# Patient Record
Sex: Female | Born: 1965
Health system: Southern US, Community
[De-identification: ages and names within clinical notes are randomized; demographics above are authoritative.]

## PROBLEM LIST (undated history)

## (undated) DIAGNOSIS — T7840XA Allergy, unspecified, initial encounter: Secondary | ICD-10-CM

## (undated) DIAGNOSIS — M47812 Spondylosis without myelopathy or radiculopathy, cervical region: Secondary | ICD-10-CM

## (undated) DIAGNOSIS — M419 Scoliosis, unspecified: Secondary | ICD-10-CM

## (undated) DIAGNOSIS — Z79890 Hormone replacement therapy (postmenopausal): Secondary | ICD-10-CM

## (undated) DIAGNOSIS — I10 Essential (primary) hypertension: Secondary | ICD-10-CM

## (undated) DIAGNOSIS — F419 Anxiety disorder, unspecified: Secondary | ICD-10-CM

## (undated) DIAGNOSIS — G43909 Migraine, unspecified, not intractable, without status migrainosus: Secondary | ICD-10-CM

## (undated) HISTORY — DX: Hormone replacement therapy: Z79.890

## (undated) HISTORY — DX: Scoliosis, unspecified: M41.9

## (undated) HISTORY — DX: Spondylosis without myelopathy or radiculopathy, cervical region: M47.812

## (undated) HISTORY — DX: Migraine, unspecified, not intractable, without status migrainosus: G43.909

## (undated) HISTORY — DX: Anxiety disorder, unspecified: F41.9

## (undated) HISTORY — DX: Essential (primary) hypertension: I10

## (undated) HISTORY — PX: OTHER SURGICAL HISTORY: SHX169

## (undated) HISTORY — DX: Allergy, unspecified, initial encounter: T78.40XA

---

## 1997-07-16 HISTORY — PX: LIPOMA EXCISION: SHX5283

## 1997-08-26 ENCOUNTER — Inpatient Hospital Stay (HOSPITAL_COMMUNITY): Admission: AD | Admit: 1997-08-26 | Discharge: 1997-08-28 | Payer: Self-pay | Admitting: Obstetrics & Gynecology

## 1997-08-31 ENCOUNTER — Encounter: Admission: RE | Admit: 1997-08-31 | Discharge: 1997-11-29 | Payer: Self-pay | Admitting: Obstetrics & Gynecology

## 1997-10-12 ENCOUNTER — Other Ambulatory Visit: Admission: RE | Admit: 1997-10-12 | Discharge: 1997-10-12 | Payer: Self-pay | Admitting: Obstetrics and Gynecology

## 1998-10-26 ENCOUNTER — Other Ambulatory Visit: Admission: RE | Admit: 1998-10-26 | Discharge: 1998-10-26 | Payer: Self-pay | Admitting: Obstetrics and Gynecology

## 1999-06-20 ENCOUNTER — Encounter (INDEPENDENT_AMBULATORY_CARE_PROVIDER_SITE_OTHER): Payer: Self-pay

## 1999-06-20 ENCOUNTER — Other Ambulatory Visit: Admission: RE | Admit: 1999-06-20 | Discharge: 1999-06-20 | Payer: Self-pay | Admitting: General Surgery

## 2000-01-05 ENCOUNTER — Other Ambulatory Visit: Admission: RE | Admit: 2000-01-05 | Discharge: 2000-01-05 | Payer: Self-pay | Admitting: Obstetrics and Gynecology

## 2000-11-11 ENCOUNTER — Other Ambulatory Visit: Admission: RE | Admit: 2000-11-11 | Discharge: 2000-11-11 | Payer: Self-pay | Admitting: Obstetrics and Gynecology

## 2002-07-03 ENCOUNTER — Other Ambulatory Visit: Admission: RE | Admit: 2002-07-03 | Discharge: 2002-07-03 | Payer: Self-pay | Admitting: Obstetrics and Gynecology

## 2003-08-03 ENCOUNTER — Other Ambulatory Visit: Admission: RE | Admit: 2003-08-03 | Discharge: 2003-08-03 | Payer: Self-pay | Admitting: Obstetrics and Gynecology

## 2004-08-16 ENCOUNTER — Other Ambulatory Visit: Admission: RE | Admit: 2004-08-16 | Discharge: 2004-08-16 | Payer: Self-pay | Admitting: Obstetrics and Gynecology

## 2005-09-06 ENCOUNTER — Other Ambulatory Visit: Admission: RE | Admit: 2005-09-06 | Discharge: 2005-09-06 | Payer: Self-pay | Admitting: Obstetrics and Gynecology

## 2007-05-18 ENCOUNTER — Emergency Department (HOSPITAL_COMMUNITY): Admission: EM | Admit: 2007-05-18 | Discharge: 2007-05-18 | Payer: Self-pay | Admitting: Emergency Medicine

## 2012-01-28 ENCOUNTER — Encounter: Payer: Self-pay | Admitting: Family Medicine

## 2012-01-28 ENCOUNTER — Ambulatory Visit (INDEPENDENT_AMBULATORY_CARE_PROVIDER_SITE_OTHER): Payer: 59 | Admitting: Family Medicine

## 2012-01-28 VITALS — BP 112/74 | HR 68 | Temp 98.6°F | Ht 65.0 in | Wt 142.0 lb

## 2012-01-28 DIAGNOSIS — Z0000 Encounter for general adult medical examination without abnormal findings: Secondary | ICD-10-CM

## 2012-01-28 DIAGNOSIS — I10 Essential (primary) hypertension: Secondary | ICD-10-CM | POA: Insufficient documentation

## 2012-01-28 NOTE — Assessment & Plan Note (Signed)
bp has been great lately--- will stop norvasc since she was only taking it periodically and recheck in 3 months or sooner prn Pt will check bp as well

## 2012-01-28 NOTE — Patient Instructions (Addendum)
Preventive Care for Adults, Female A healthy lifestyle and preventive care can promote health and wellness. Preventive health guidelines for women include the following key practices.  A routine yearly physical is a good way to check with your caregiver about your health and preventive screening. It is a chance to share any concerns and updates on your health, and to receive a thorough exam.   Visit your dentist for a routine exam and preventive care every 6 months. Brush your teeth twice a day and floss once a day. Good oral hygiene prevents tooth decay and gum disease.   The frequency of eye exams is based on your age, health, family medical history, use of contact lenses, and other factors. Follow your caregiver's recommendations for frequency of eye exams.   Eat a healthy diet. Foods like vegetables, fruits, whole grains, low-fat dairy products, and lean protein foods contain the nutrients you need without too many calories. Decrease your intake of foods high in solid fats, added sugars, and salt. Eat the right amount of calories for you.Get information about a proper diet from your caregiver, if necessary.   Regular physical exercise is one of the most important things you can do for your health. Most adults should get at least 150 minutes of moderate-intensity exercise (any activity that increases your heart rate and causes you to sweat) each week. In addition, most adults need muscle-strengthening exercises on 2 or more days a week.   Maintain a healthy weight. The body mass index (BMI) is a screening tool to identify possible weight problems. It provides an estimate of body fat based on height and weight. Your caregiver can help determine your BMI, and can help you achieve or maintain a healthy weight.For adults 20 years and older:   A BMI below 18.5 is considered underweight.   A BMI of 18.5 to 24.9 is normal.   A BMI of 25 to 29.9 is considered overweight.   A BMI of 30 and above is  considered obese.   Maintain normal blood lipids and cholesterol levels by exercising and minimizing your intake of saturated fat. Eat a balanced diet with plenty of fruit and vegetables. Blood tests for lipids and cholesterol should begin at age 20 and be repeated every 5 years. If your lipid or cholesterol levels are high, you are over 50, or you are at high risk for heart disease, you may need your cholesterol levels checked more frequently.Ongoing high lipid and cholesterol levels should be treated with medicines if diet and exercise are not effective.   If you smoke, find out from your caregiver how to quit. If you do not use tobacco, do not start.   If you are pregnant, do not drink alcohol. If you are breastfeeding, be very cautious about drinking alcohol. If you are not pregnant and choose to drink alcohol, do not exceed 1 drink per day. One drink is considered to be 12 ounces (355 mL) of beer, 5 ounces (148 mL) of wine, or 1.5 ounces (44 mL) of liquor.   Avoid use of street drugs. Do not share needles with anyone. Ask for help if you need support or instructions about stopping the use of drugs.   High blood pressure causes heart disease and increases the risk of stroke. Your blood pressure should be checked at least every 1 to 2 years. Ongoing high blood pressure should be treated with medicines if weight loss and exercise are not effective.   If you are 55 to 47   years old, ask your caregiver if you should take aspirin to prevent strokes.   Diabetes screening involves taking a blood sample to check your fasting blood sugar level. This should be done once every 3 years, after age 45, if you are within normal weight and without risk factors for diabetes. Testing should be considered at a younger age or be carried out more frequently if you are overweight and have at least 1 risk factor for diabetes.   Breast cancer screening is essential preventive care for women. You should practice "breast  self-awareness." This means understanding the normal appearance and feel of your breasts and may include breast self-examination. Any changes detected, no matter how small, should be reported to a caregiver. Women in their 20s and 30s should have a clinical breast exam (CBE) by a caregiver as part of a regular health exam every 1 to 3 years. After age 40, women should have a CBE every year. Starting at age 40, women should consider having a mammography (breast X-ray test) every year. Women who have a family history of breast cancer should talk to their caregiver about genetic screening. Women at a high risk of breast cancer should talk to their caregivers about having magnetic resonance imaging (MRI) and a mammography every year.   The Pap test is a screening test for cervical cancer. A Pap test can show cell changes on the cervix that might become cervical cancer if left untreated. A Pap test is a procedure in which cells are obtained and examined from the lower end of the uterus (cervix).   Women should have a Pap test starting at age 21.   Between ages 21 and 29, Pap tests should be repeated every 2 years.   Beginning at age 30, you should have a Pap test every 3 years as long as the past 3 Pap tests have been normal.   Some women have medical problems that increase the chance of getting cervical cancer. Talk to your caregiver about these problems. It is especially important to talk to your caregiver if a new problem develops soon after your last Pap test. In these cases, your caregiver may recommend more frequent screening and Pap tests.   The above recommendations are the same for women who have or have not gotten the vaccine for human papillomavirus (HPV).   If you had a hysterectomy for a problem that was not cancer or a condition that could lead to cancer, then you no longer need Pap tests. Even if you no longer need a Pap test, a regular exam is a good idea to make sure no other problems are  starting.   If you are between ages 65 and 70, and you have had normal Pap tests going back 10 years, you no longer need Pap tests. Even if you no longer need a Pap test, a regular exam is a good idea to make sure no other problems are starting.   If you have had past treatment for cervical cancer or a condition that could lead to cancer, you need Pap tests and screening for cancer for at least 20 years after your treatment.   If Pap tests have been discontinued, risk factors (such as a new sexual partner) need to be reassessed to determine if screening should be resumed.   The HPV test is an additional test that may be used for cervical cancer screening. The HPV test looks for the virus that can cause the cell changes on the cervix.   The cells collected during the Pap test can be tested for HPV. The HPV test could be used to screen women aged 30 years and older, and should be used in women of any age who have unclear Pap test results. After the age of 30, women should have HPV testing at the same frequency as a Pap test.   Colorectal cancer can be detected and often prevented. Most routine colorectal cancer screening begins at the age of 50 and continues through age 75. However, your caregiver may recommend screening at an earlier age if you have risk factors for colon cancer. On a yearly basis, your caregiver may provide home test kits to check for hidden blood in the stool. Use of a small camera at the end of a tube, to directly examine the colon (sigmoidoscopy or colonoscopy), can detect the earliest forms of colorectal cancer. Talk to your caregiver about this at age 50, when routine screening begins. Direct examination of the colon should be repeated every 5 to 10 years through age 75, unless early forms of pre-cancerous polyps or small growths are found.   Hepatitis C blood testing is recommended for all people born from 1945 through 1965 and any individual with known risks for hepatitis C.    Practice safe sex. Use condoms and avoid high-risk sexual practices to reduce the spread of sexually transmitted infections (STIs). STIs include gonorrhea, chlamydia, syphilis, trichomonas, herpes, HPV, and human immunodeficiency virus (HIV). Herpes, HIV, and HPV are viral illnesses that have no cure. They can result in disability, cancer, and death. Sexually active women aged 25 and younger should be checked for chlamydia. Older women with new or multiple partners should also be tested for chlamydia. Testing for other STIs is recommended if you are sexually active and at increased risk.   Osteoporosis is a disease in which the bones lose minerals and strength with aging. This can result in serious bone fractures. The risk of osteoporosis can be identified using a bone density scan. Women ages 65 and over and women at risk for fractures or osteoporosis should discuss screening with their caregivers. Ask your caregiver whether you should take a calcium supplement or vitamin D to reduce the rate of osteoporosis.   Menopause can be associated with physical symptoms and risks. Hormone replacement therapy is available to decrease symptoms and risks. You should talk to your caregiver about whether hormone replacement therapy is right for you.   Use sunscreen with sun protection factor (SPF) of 30 or more. Apply sunscreen liberally and repeatedly throughout the day. You should seek shade when your shadow is shorter than you. Protect yourself by wearing long sleeves, pants, a wide-brimmed hat, and sunglasses year round, whenever you are outdoors.   Once a month, do a whole body skin exam, using a mirror to look at the skin on your back. Notify your caregiver of new moles, moles that have irregular borders, moles that are larger than a pencil eraser, or moles that have changed in shape or color.   Stay current with required immunizations.   Influenza. You need a dose every fall (or winter). The composition of  the flu vaccine changes each year, so being vaccinated once is not enough.   Pneumococcal polysaccharide. You need 1 to 2 doses if you smoke cigarettes or if you have certain chronic medical conditions. You need 1 dose at age 65 (or older) if you have never been vaccinated.   Tetanus, diphtheria, pertussis (Tdap, Td). Get 1 dose of   Tdap vaccine if you are younger than age 65, are over 65 and have contact with an infant, are a healthcare worker, are pregnant, or simply want to be protected from whooping cough. After that, you need a Td booster dose every 10 years. Consult your caregiver if you have not had at least 3 tetanus and diphtheria-containing shots sometime in your life or have a deep or dirty wound.   HPV. You need this vaccine if you are a woman age 26 or younger. The vaccine is given in 3 doses over 6 months.   Measles, mumps, rubella (MMR). You need at least 1 dose of MMR if you were born in 1957 or later. You may also need a second dose.   Meningococcal. If you are age 19 to 21 and a first-year college student living in a residence hall, or have one of several medical conditions, you need to get vaccinated against meningococcal disease. You may also need additional booster doses.   Zoster (shingles). If you are age 60 or older, you should get this vaccine.   Varicella (chickenpox). If you have never had chickenpox or you were vaccinated but received only 1 dose, talk to your caregiver to find out if you need this vaccine.   Hepatitis A. You need this vaccine if you have a specific risk factor for hepatitis A virus infection or you simply wish to be protected from this disease. The vaccine is usually given as 2 doses, 6 to 18 months apart.   Hepatitis B. You need this vaccine if you have a specific risk factor for hepatitis B virus infection or you simply wish to be protected from this disease. The vaccine is given in 3 doses, usually over 6 months.  Preventive Services /  Frequency Ages 19 to 39  Blood pressure check.** / Every 1 to 2 years.   Lipid and cholesterol check.** / Every 5 years beginning at age 20.   Clinical breast exam.** / Every 3 years for women in their 20s and 30s.   Pap test.** / Every 2 years from ages 21 through 29. Every 3 years starting at age 30 through age 65 or 70 with a history of 3 consecutive normal Pap tests.   HPV screening.** / Every 3 years from ages 30 through ages 65 to 70 with a history of 3 consecutive normal Pap tests.   Hepatitis C blood test.** / For any individual with known risks for hepatitis C.   Skin self-exam. / Monthly.   Influenza immunization.** / Every year.   Pneumococcal polysaccharide immunization.** / 1 to 2 doses if you smoke cigarettes or if you have certain chronic medical conditions.   Tetanus, diphtheria, pertussis (Tdap, Td) immunization. / A one-time dose of Tdap vaccine. After that, you need a Td booster dose every 10 years.   HPV immunization. / 3 doses over 6 months, if you are 26 and younger.   Measles, mumps, rubella (MMR) immunization. / You need at least 1 dose of MMR if you were born in 1957 or later. You may also need a second dose.   Meningococcal immunization. / 1 dose if you are age 19 to 21 and a first-year college student living in a residence hall, or have one of several medical conditions, you need to get vaccinated against meningococcal disease. You may also need additional booster doses.   Varicella immunization.** / Consult your caregiver.   Hepatitis A immunization.** / Consult your caregiver. 2 doses, 6 to 18 months   apart.   Hepatitis B immunization.** / Consult your caregiver. 3 doses usually over 6 months.  Ages 40 to 64  Blood pressure check.** / Every 1 to 2 years.   Lipid and cholesterol check.** / Every 5 years beginning at age 20.   Clinical breast exam.** / Every year after age 40.   Mammogram.** / Every year beginning at age 40 and continuing for as  long as you are in good health. Consult with your caregiver.   Pap test.** / Every 3 years starting at age 30 through age 65 or 70 with a history of 3 consecutive normal Pap tests.   HPV screening.** / Every 3 years from ages 30 through ages 65 to 70 with a history of 3 consecutive normal Pap tests.   Fecal occult blood test (FOBT) of stool. / Every year beginning at age 50 and continuing until age 75. You may not need to do this test if you get a colonoscopy every 10 years.   Flexible sigmoidoscopy or colonoscopy.** / Every 5 years for a flexible sigmoidoscopy or every 10 years for a colonoscopy beginning at age 50 and continuing until age 75.   Hepatitis C blood test.** / For all people born from 1945 through 1965 and any individual with known risks for hepatitis C.   Skin self-exam. / Monthly.   Influenza immunization.** / Every year.   Pneumococcal polysaccharide immunization.** / 1 to 2 doses if you smoke cigarettes or if you have certain chronic medical conditions.   Tetanus, diphtheria, pertussis (Tdap, Td) immunization.** / A one-time dose of Tdap vaccine. After that, you need a Td booster dose every 10 years.   Measles, mumps, rubella (MMR) immunization. / You need at least 1 dose of MMR if you were born in 1957 or later. You may also need a second dose.   Varicella immunization.** / Consult your caregiver.   Meningococcal immunization.** / Consult your caregiver.   Hepatitis A immunization.** / Consult your caregiver. 2 doses, 6 to 18 months apart.   Hepatitis B immunization.** / Consult your caregiver. 3 doses, usually over 6 months.  Ages 65 and over  Blood pressure check.** / Every 1 to 2 years.   Lipid and cholesterol check.** / Every 5 years beginning at age 20.   Clinical breast exam.** / Every year after age 40.   Mammogram.** / Every year beginning at age 40 and continuing for as long as you are in good health. Consult with your caregiver.   Pap test.** /  Every 3 years starting at age 30 through age 65 or 70 with a 3 consecutive normal Pap tests. Testing can be stopped between 65 and 70 with 3 consecutive normal Pap tests and no abnormal Pap or HPV tests in the past 10 years.   HPV screening.** / Every 3 years from ages 30 through ages 65 or 70 with a history of 3 consecutive normal Pap tests. Testing can be stopped between 65 and 70 with 3 consecutive normal Pap tests and no abnormal Pap or HPV tests in the past 10 years.   Fecal occult blood test (FOBT) of stool. / Every year beginning at age 50 and continuing until age 75. You may not need to do this test if you get a colonoscopy every 10 years.   Flexible sigmoidoscopy or colonoscopy.** / Every 5 years for a flexible sigmoidoscopy or every 10 years for a colonoscopy beginning at age 50 and continuing until age 75.   Hepatitis   C blood test.** / For all people born from 1945 through 1965 and any individual with known risks for hepatitis C.   Osteoporosis screening.** / A one-time screening for women ages 65 and over and women at risk for fractures or osteoporosis.   Skin self-exam. / Monthly.   Influenza immunization.** / Every year.   Pneumococcal polysaccharide immunization.** / 1 dose at age 65 (or older) if you have never been vaccinated.   Tetanus, diphtheria, pertussis (Tdap, Td) immunization. / A one-time dose of Tdap vaccine if you are over 65 and have contact with an infant, are a healthcare worker, or simply want to be protected from whooping cough. After that, you need a Td booster dose every 10 years.   Varicella immunization.** / Consult your caregiver.   Meningococcal immunization.** / Consult your caregiver.   Hepatitis A immunization.** / Consult your caregiver. 2 doses, 6 to 18 months apart.   Hepatitis B immunization.** / Check with your caregiver. 3 doses, usually over 6 months.  ** Family history and personal history of risk and conditions may change your caregiver's  recommendations. Document Released: 08/28/2001 Document Revised: 06/21/2011 Document Reviewed: 11/27/2010 [employer] Patient Information 2012 [employer]. 

## 2012-01-28 NOTE — Progress Notes (Signed)
  Subjective:     Regina [name] is a 46 y.o. female and is here for a comprehensive physical exam. The patient reports no problems. Pt here to establish secondary to bp.    History   Social History  . Marital Status: Married    Spouse Name: N/A    Number of Children: N/A  . Years of Education: N/A   Occupational History  . Not on file.   Social History Main Topics  . Smoking status: Never Smoker   . Smokeless tobacco: Never Used  . Alcohol Use: Yes     Rarely  . Drug Use: No  . Sexually Active: Not on file   Other Topics Concern  . Not on file   Social History Narrative  . No narrative on file   No health maintenance topics applied.  The following portions of the patient's history were reviewed and updated as appropriate: allergies, current medications, past family history, past medical history, past social history, past surgical history and problem list.  Review of Systems Review of Systems  Constitutional: Negative for activity change, appetite change and fatigue.  HENT: Negative for hearing loss, congestion, tinnitus and ear discharge.  dentist q56m Eyes: Negative for visual disturbance (see optho q1y -- vision corrected to 20/20 with glasses).  Respiratory: Negative for cough, chest tightness and shortness of breath.   Cardiovascular: Negative for chest pain, palpitations and leg swelling.  Gastrointestinal: Negative for abdominal pain, diarrhea, constipation and abdominal distention.  Genitourinary: Negative for urgency, frequency, decreased urine volume and difficulty urinating.  Musculoskeletal: Negative for back pain, arthralgias and gait problem.  Skin: Negative for color change, pallor and rash.  Neurological: Negative for dizziness, light-headedness, numbness and headaches.  Hematological: Negative for adenopathy. Does not bruise/bleed easily.  Psychiatric/Behavioral: Negative for suicidal ideas, confusion, sleep disturbance, self-injury, dysphoric mood,  decreased concentration and agitation.       Objective:    BP 112/74  Pulse 68  Temp 98.6 F (37 C) (Oral)  Ht 5\' 5"  (1.651 m)  Wt 142 lb (64.411 kg)  BMI 23.63 kg/m2  SpO2 97% General appearance: alert, cooperative, appears stated age and no distress Head: Normocephalic, without obvious abnormality, atraumatic Eyes: conjunctivae/corneas clear. PERRL, EOM's intact. Fundi benign. Ears: normal TM's and external ear canals both ears Nose: Nares normal. Septum midline. Mucosa normal. No drainage or sinus tenderness. Throat: lips, mucosa, and tongue normal; teeth and gums normal Neck: no adenopathy, no carotid bruit, no JVD, supple, symmetrical, trachea midline and thyroid not enlarged, symmetric, no tenderness/mass/nodules Back: symmetric, no curvature. ROM normal. No CVA tenderness. Lungs: clear to auscultation bilaterally Breasts: gyn Heart: regular rate and rhythm, S1, S2 normal, no murmur, click, rub or gallop Abdomen: soft, non-tender; bowel sounds normal; no masses,  no organomegaly Pelvic: deferred-- done by gyn Extremities: extremities normal, atraumatic, no cyanosis or edema Pulses: 2+ and symmetric Skin: Skin color, texture, turgor normal. No rashes or lesions Lymph nodes: Cervical, supraclavicular, and axillary nodes normal. Neurologic: Alert and oriented X 3, normal strength and tone. Normal symmetric reflexes. Normal coordination and gait psych-- no depression/ anxiety    Assessment:    Healthy female exam.   HTN--  Pt has lost weight and eats healthy---  bp low today---she was not taking norvasc regularly---d/c norvasc Plan:    ghm utd Check labs  See After Visit Summary for Counseling Recommendations

## 2012-01-29 ENCOUNTER — Other Ambulatory Visit (INDEPENDENT_AMBULATORY_CARE_PROVIDER_SITE_OTHER): Payer: 59

## 2012-01-29 DIAGNOSIS — Z0000 Encounter for general adult medical examination without abnormal findings: Secondary | ICD-10-CM

## 2012-01-29 DIAGNOSIS — I10 Essential (primary) hypertension: Secondary | ICD-10-CM

## 2012-01-29 LAB — BASIC METABOLIC PANEL
BUN: 16 mg/dL (ref 6–23)
Calcium: 8.9 mg/dL (ref 8.4–10.5)
Chloride: 106 mEq/L (ref 96–112)
Potassium: 3.2 mEq/L — ABNORMAL LOW (ref 3.5–5.1)

## 2012-01-29 LAB — CBC WITH DIFFERENTIAL/PLATELET
Basophils Relative: 1 % (ref 0.0–3.0)
Eosinophils Absolute: 0 10*3/uL (ref 0.0–0.7)
Eosinophils Relative: 1.3 % (ref 0.0–5.0)
HCT: 37.9 % (ref 36.0–46.0)
Lymphs Abs: 1.3 10*3/uL (ref 0.7–4.0)
MCHC: 33.3 g/dL (ref 30.0–36.0)
MCV: 94.5 fl (ref 78.0–100.0)
Monocytes Relative: 7.2 % (ref 3.0–12.0)
Neutrophils Relative %: 54 % (ref 43.0–77.0)
RBC: 4.01 Mil/uL (ref 3.87–5.11)

## 2012-01-29 LAB — URINALYSIS
Bilirubin Urine: NEGATIVE
Leukocytes, UA: NEGATIVE
Nitrite: NEGATIVE
Urine Glucose: NEGATIVE
pH: 6 (ref 5.0–8.0)

## 2012-01-29 LAB — HEPATIC FUNCTION PANEL
ALT: 19 U/L (ref 0–35)
Albumin: 4 g/dL (ref 3.5–5.2)
Bilirubin, Direct: 0.1 mg/dL (ref 0.0–0.3)
Total Bilirubin: 0.7 mg/dL (ref 0.3–1.2)
Total Protein: 6.9 g/dL (ref 6.0–8.3)

## 2012-01-29 LAB — LIPID PANEL
Cholesterol: 168 mg/dL (ref 0–200)
HDL: 48.2 mg/dL (ref >39.00)
LDL Cholesterol: 106 mg/dL — ABNORMAL HIGH (ref 0–99)
Total CHOL/HDL Ratio: 3

## 2012-01-29 LAB — TSH: TSH: 1.57 u[IU]/mL (ref 0.35–5.50)

## 2012-01-29 NOTE — Progress Notes (Signed)
Labs only

## 2012-03-18 ENCOUNTER — Other Ambulatory Visit: Payer: Self-pay | Admitting: Family Medicine

## 2012-03-18 MED ORDER — HYDROCHLOROTHIAZIDE 25 MG PO TABS: 25.0000 mg | ORAL_TABLET | Freq: Every day | ORAL | Status: DC

## 2012-03-18 NOTE — Telephone Encounter (Signed)
Refills x  Last ov 7.15.13 V70  1-Hydrochlorothiazide (Tab) HYDRODIURIL 25 MG Take 1 tablet by mouth daily  2-amLODipine (NORVASC) 2.5 MG tablet  NOTE THIS MED STATES Discontinue Reason: Completed Course  Requesting 90-day supply

## 2012-03-18 NOTE — Telephone Encounter (Signed)
Norvasc was removed from the medication list.     KP

## 2012-08-30 ENCOUNTER — Other Ambulatory Visit: Payer: Self-pay

## 2012-09-29 ENCOUNTER — Ambulatory Visit (INDEPENDENT_AMBULATORY_CARE_PROVIDER_SITE_OTHER): Payer: 59 | Admitting: Family Medicine

## 2012-09-29 ENCOUNTER — Encounter: Payer: Self-pay | Admitting: Family Medicine

## 2012-09-29 VITALS — BP 118/80 | HR 53 | Temp 98.3°F | Wt 154.8 lb

## 2012-09-29 DIAGNOSIS — R079 Chest pain, unspecified: Secondary | ICD-10-CM

## 2012-09-29 DIAGNOSIS — R0781 Pleurodynia: Secondary | ICD-10-CM | POA: Insufficient documentation

## 2012-09-29 MED ORDER — TRAMADOL HCL 50 MG PO TABS: 50.0000 mg | ORAL_TABLET | Freq: Three times a day (TID) | ORAL | Status: DC | PRN

## 2012-09-29 NOTE — Assessment & Plan Note (Signed)
Suspect fracture Brace with pillow No resp distress so will hold off on xray.  Pt agrees. Ultram for pain prn rto prn

## 2012-09-29 NOTE — Patient Instructions (Signed)
Rib Fracture  Your caregiver has diagnosed you as having a rib fracture (a break). This can occur by a blow to the chest, by a fall against a hard object, or by violent coughing or sneezing. There may be one or many breaks. Rib fractures may heal on their own within 3 to 8 weeks. The longer healing period is usually associated with a continued cough or other aggravating activities.  HOME CARE INSTRUCTIONS    Avoid strenuous activity. Be careful during activities and avoid bumping the injured rib. Activities that cause pain pull on the fracture site(s) and are best avoided if possible.   Eat a normal, well-balanced diet. Drink plenty of fluids to avoid constipation.   Take deep breaths several times a day to keep lungs free of infection. Try to cough several times a day, splinting the injured area with a pillow. This will help prevent pneumonia.   Do not wear a rib belt or binder. These restrict breathing which can lead to pneumonia.   Only take over-the-counter or prescription medicines for pain, discomfort, or fever as directed by your caregiver.  SEEK MEDICAL CARE IF:   You develop a continual cough, associated with thick or bloody sputum.  SEEK IMMEDIATE MEDICAL CARE IF:    You have a fever.   You have difficulty breathing.   You have nausea (feeling sick to your stomach), vomiting, or abdominal (belly) pain.   You have worsening pain, not controlled with medications.  Document Released: 07/02/2005 Document Revised: 09/24/2011 Document Reviewed: 12/04/2006  [employer] Patient Information 2013 [employer].

## 2012-09-29 NOTE — Progress Notes (Signed)
  Subjective:    Patient ID: Regina [name], female    DOB: Jun 15, 1966, 47 y.o.   MRN: [id number]  HPI Pt here c/o L sided rib pain after falling about 2 weeks ago ---sit started to improve and then she went skiing last weekend. Pt has been wrapping it up because the pressure takes the pain away.  No other complaints.     Review of Systems As above    Objective:   Physical Exam BP 118/80  Pulse 53  Temp(Src) 98.3 F (36.8 C) (Oral)  Wt 154 lb 12.8 oz (70.217 kg)  BMI 25.76 kg/m2  SpO2 99% General appearance: alert, cooperative and no distress Lungs: clear to auscultation bilaterally Chest wall: L sided rib pain ant, no crepitus Heart: S1, S2 normal       Assessment & Plan:

## 2012-10-21 ENCOUNTER — Telehealth: Payer: Self-pay | Admitting: *Deleted

## 2012-10-21 NOTE — Telephone Encounter (Signed)
She would need ov to take simple test and to discuss.

## 2012-10-21 NOTE — Telephone Encounter (Signed)
Apt scheduled 10/24/12 at 9.     KP

## 2012-10-21 NOTE — Telephone Encounter (Signed)
  Pt left VM that we can disregard previous message that she has spoken with pharmacy this AM and they do have her Rx. Pt request a call back from Dr Laury Axon assistance to discuss another issue. Called Pt back she would like to know if Dr Laury Axon would Rx ADD meds for her and what is the process in being Rx a med.Pt denies any prior treatment with ADD meds.  Pt notes that her son is already currently taking a ADD med.Please advise

## 2012-10-21 NOTE — Telephone Encounter (Signed)
Pt states that she faxed over a Rx  Form on last week for [facility]. Pt calling to see if we received fax because she is currently out of med and [facility] has not received it back .Please advise

## 2012-10-23 ENCOUNTER — Encounter: Payer: Self-pay | Admitting: Lab

## 2012-10-24 ENCOUNTER — Ambulatory Visit (INDEPENDENT_AMBULATORY_CARE_PROVIDER_SITE_OTHER): Payer: 59 | Admitting: Family Medicine

## 2012-10-24 ENCOUNTER — Encounter: Payer: Self-pay | Admitting: Lab

## 2012-10-24 ENCOUNTER — Encounter: Payer: Self-pay | Admitting: Family Medicine

## 2012-10-24 VITALS — BP 112/80 | HR 63 | Temp 98.3°F | Wt 149.8 lb

## 2012-10-24 DIAGNOSIS — F988 Other specified behavioral and emotional disorders with onset usually occurring in childhood and adolescence: Secondary | ICD-10-CM

## 2012-10-24 DIAGNOSIS — I10 Essential (primary) hypertension: Secondary | ICD-10-CM

## 2012-10-24 MED ORDER — AMPHETAMINE-DEXTROAMPHET ER 25 MG PO CP24: 25.0000 mg | ORAL_CAPSULE | ORAL | Status: DC

## 2012-10-24 NOTE — Patient Instructions (Addendum)
Attention Deficit Hyperactivity Disorder Attention deficit hyperactivity disorder (ADHD) is a problem with behavior issues based on the way the brain functions (neurobehavioral disorder). It is a common reason for behavior and academic problems in school. CAUSES  The cause of ADHD is unknown in most cases. It may run in families. It sometimes can be associated with learning disabilities and other behavioral problems. SYMPTOMS  There are 3 types of ADHD. The 3 types and some of the symptoms include:  Inattentive  Gets bored or distracted easily.  Loses or forgets things. Forgets to hand in homework.  Has trouble organizing or completing tasks.  Difficulty staying on task.  An inability to organize daily tasks and school work.  Leaving projects, chores, or homework unfinished.  Trouble paying attention or responding to details. Careless mistakes.  Difficulty following directions. Often seems like is not listening.  Dislikes activities that require sustained attention (like chores or homework).  Hyperactive-impulsive  Feels like it is impossible to sit still or stay in a seat. Fidgeting with hands and feet.  Trouble waiting turn.  Talking too much or out of turn. Interruptive.  Speaks or acts impulsively.  Aggressive, disruptive behavior.  Constantly busy or on the go, noisy.  Combined  Has symptoms of both of the above. Often children with ADHD feel discouraged about themselves and with school. They often perform well below their abilities in school. These symptoms can cause problems in home, school, and in relationships with peers. As children get older, the excess motor activities can calm down, but the problems with paying attention and staying organized persist. Most children do not outgrow ADHD but with good treatment can learn to cope with the symptoms. DIAGNOSIS  When ADHD is suspected, the diagnosis should be made by professionals trained in ADHD.  Diagnosis will  include:  Ruling out other reasons for the child's behavior.  The caregivers will check with the child's school and check their medical records.  They will talk to teachers and parents.  Behavior rating scales for the child will be filled out by those dealing with the child on a daily basis. A diagnosis is made only after all information has been considered. TREATMENT  Treatment usually includes behavioral treatment often along with medicines. It may include stimulant medicines. The stimulant medicines decrease impulsivity and hyperactivity and increase attention. Other medicines used include antidepressants and certain blood pressure medicines. Most experts agree that treatment for ADHD should address all aspects of the child's functioning. Treatment should not be limited to the use of medicines alone. Treatment should include structured classroom management. The parents must receive education to address rewarding good behavior, discipline, and limit-setting. Tutoring or behavioral therapy or both should be available for the child. If untreated, the disorder can have long-term serious effects into adolescence and adulthood. HOME CARE INSTRUCTIONS   Often with ADHD there is a lot of frustration among the family in dealing with the illness. There is often blame and anger that is not warranted. This is a life long illness. There is no way to prevent ADHD. In many cases, because the problem affects the family as a whole, the entire family may need help. A therapist can help the family find better ways to handle the disruptive behaviors and promote change. If the child is young, most of the therapist's work is with the parents. Parents will learn techniques for coping with and improving their child's behavior. Sometimes only the child with the ADHD needs counseling. Your caregivers can help   you make these decisions.  Children with ADHD may need help in organizing. Some helpful tips include:  Keep  routines the same every day from wake-up time to bedtime. Schedule everything. This includes homework and playtime. This should include outdoor and indoor recreation. Keep the schedule on the refrigerator or a bulletin board where it is frequently seen. Mark schedule changes as far in advance as possible.  Have a place for everything and keep everything in its place. This includes clothing, backpacks, and school supplies.  Encourage writing down assignments and bringing home needed books.  Offer your child a well-balanced diet. Breakfast is especially important for school performance. Children should avoid drinks with caffeine including:  Soft drinks.  Coffee.  Tea.  However, some older children (adolescents) may find these drinks helpful in improving their attention.  Children with ADHD need consistent rules that they can understand and follow. If rules are followed, give small rewards. Children with ADHD often receive, and expect, criticism. Look for good behavior and praise it. Set realistic goals. Give clear instructions. Look for activities that can foster success and self-esteem. Make time for pleasant activities with your child. Give lots of affection.  Parents are their children's greatest advocates. Learn as much as possible about ADHD. This helps you become a stronger and better advocate for your child. It also helps you educate your child's teachers and instructors if they feel inadequate in these areas. Parent support groups are often helpful. A national group with local chapters is called CHADD (Children and Adults with Attention Deficit Hyperactivity Disorder). PROGNOSIS  There is no cure for ADHD. Children with the disorder seldom outgrow it. Many find adaptive ways to accommodate the ADHD as they mature. SEEK MEDICAL CARE IF:  Your child has repeated muscle twitches, cough or speech outbursts.  Your child has sleep problems.  Your child has a marked loss of  appetite.  Your child develops depression.  Your child has new or worsening behavioral problems.  Your child develops dizziness.  Your child has a racing heart.  Your child has stomach pains.  Your child develops headaches. Document Released: 06/22/2002 Document Revised: 09/24/2011 Document Reviewed: 02/02/2008 [employer] Patient Information 2013 [employer].  

## 2012-10-24 NOTE — Assessment & Plan Note (Signed)
adderall xr 25mg  #30 1 po d  rto 6 months or sooner prn

## 2012-10-24 NOTE — Progress Notes (Signed)
  Subjective:    Patient ID: Regina [name], female    DOB: 04-04-1966, 47 y.o.   MRN: [id number]  HPI Pt here to be evaluated for ADD.  Her son has it. She is struggling to get work done ---it takes her a long time.   When she was younger it took her a long time to get homework done.  She has trouble focusing and staying on task.   Adult self report scale---+ ADD  Review of Systems As above     Objective:   Physical Exam  BP 112/80  Pulse 63  Temp(Src) 98.3 F (36.8 C) (Oral)  Wt 149 lb 12.8 oz (67.949 kg)  BMI 24.93 kg/m2  SpO2 98% General appearance: alert, cooperative, appears stated age and no distress Lungs: clear to auscultation bilaterally Heart: S1, S2 normal      Assessment & Plan:

## 2012-10-24 NOTE — Assessment & Plan Note (Signed)
stable °

## 2012-11-15 ENCOUNTER — Encounter: Payer: Self-pay | Admitting: Family Medicine

## 2012-11-27 ENCOUNTER — Telehealth: Payer: Self-pay | Admitting: General Practice

## 2012-11-27 DIAGNOSIS — F988 Other specified behavioral and emotional disorders with onset usually occurring in childhood and adolescence: Secondary | ICD-10-CM

## 2012-11-27 MED ORDER — AMPHETAMINE-DEXTROAMPHET ER 25 MG PO CP24: 25.0000 mg | ORAL_CAPSULE | ORAL | Status: DC

## 2012-11-27 NOTE — Telephone Encounter (Signed)
Pt called to receive a refill of her Adderal medication. However, pt would like this to be sent to [facility] Rx with #90. Advised pt that I was not sure if this could be done since it is a controlled. Pt last seen and med last filled on 10/24/12.

## 2012-11-27 NOTE — Telephone Encounter (Signed)
It can be faxed to mail order

## 2012-11-27 NOTE — Telephone Encounter (Signed)
Rx printed and left at check in for the patient to pick up.     KP

## 2012-12-03 ENCOUNTER — Telehealth: Payer: Self-pay | Admitting: General Practice

## 2012-12-03 ENCOUNTER — Other Ambulatory Visit: Payer: Self-pay | Admitting: Family Medicine

## 2012-12-03 DIAGNOSIS — F988 Other specified behavioral and emotional disorders with onset usually occurring in childhood and adolescence: Secondary | ICD-10-CM

## 2012-12-03 MED ORDER — METHYLPHENIDATE HCL ER (LA) 20 MG PO CP24: ORAL_CAPSULE | ORAL | Status: DC

## 2012-12-03 NOTE — Telephone Encounter (Signed)
Patient aware Rx ready for pick up tomorrow.    KP 

## 2012-12-03 NOTE — Telephone Encounter (Signed)
Ok to switch to ritalin la--- see med list

## 2012-12-03 NOTE — Telephone Encounter (Signed)
Pt called stating that she is on Adderal. When she went to get Rx filled it was to expensive. Pt would like to know if she could possibly be switched to the generic for Ritalin [place] #30 to see if it works. Please advise.

## 2012-12-15 MED ORDER — AMPHETAMINE-DEXTROAMPHETAMINE 10 MG PO TABS: 10.0000 mg | ORAL_TABLET | Freq: Two times a day (BID) | ORAL | Status: DC

## 2012-12-15 NOTE — Addendum Note (Signed)
Addended by: Arnette Norris on: 12/15/2012 12:00 PM   Modules accepted: Orders

## 2012-12-15 NOTE — Telephone Encounter (Signed)
Patient aware Rx is ready for pick up.      KP 

## 2012-12-15 NOTE — Telephone Encounter (Signed)
Pt called back again today and stated that the Ritalin was just as expensive as the Adderal. Pt states she would need to be on Adderal 10mg  the short acting to be able to afford. Pt states she still has the Rx for the Ritalin and will bring it into the office. Please advise.

## 2012-12-15 NOTE — Telephone Encounter (Signed)
adderall 10 mg  #60  1 po bid

## 2013-01-12 ENCOUNTER — Telehealth: Payer: Self-pay | Admitting: *Deleted

## 2013-01-12 NOTE — Telephone Encounter (Signed)
Ptleft VM that she would like to get adderall 20 mg bid instead of 10 mg  #180.Marland KitchenLast OV 10-24-12, Last refilled 12-15-12 #60Please advise

## 2013-01-12 NOTE — Telephone Encounter (Signed)
Ov

## 2013-01-13 NOTE — Telephone Encounter (Signed)
Apt scheduled    KP 

## 2013-01-13 NOTE — Telephone Encounter (Signed)
Left message to call office

## 2013-01-15 ENCOUNTER — Ambulatory Visit (INDEPENDENT_AMBULATORY_CARE_PROVIDER_SITE_OTHER): Payer: 59 | Admitting: Family Medicine

## 2013-01-15 ENCOUNTER — Encounter: Payer: Self-pay | Admitting: Family Medicine

## 2013-01-15 VITALS — BP 118/74 | HR 62 | Temp 98.4°F | Wt 149.0 lb

## 2013-01-15 DIAGNOSIS — F988 Other specified behavioral and emotional disorders with onset usually occurring in childhood and adolescence: Secondary | ICD-10-CM

## 2013-01-15 MED ORDER — AMPHETAMINE-DEXTROAMPHETAMINE 20 MG PO TABS: 20.0000 mg | ORAL_TABLET | Freq: Every day | ORAL | Status: DC

## 2013-01-15 NOTE — Progress Notes (Signed)
  Subjective:    Patient ID: Regina [name], female    DOB: 28-Jun-1966, 47 y.o.   MRN: [id number]  HPI Pt here to discuss inc dose of med.  She feels like the affects of the meds wear off quick. No other problems.   Review of Systems    as above  Objective:   Physical Exam BP 118/74  Pulse 62  Temp(Src) 98.4 F (36.9 C) (Oral)  Wt 149 lb (67.586 kg)  BMI 24.79 kg/m2  SpO2 97% General appearance: alert, cooperative, appears stated age and no distress Lungs: clear to auscultation bilaterally Heart: S1, S2 normal        Assessment & Plan:

## 2013-01-15 NOTE — Patient Instructions (Signed)
Attention Deficit Hyperactivity Disorder Attention deficit hyperactivity disorder (ADHD) is a problem with behavior issues based on the way the brain functions (neurobehavioral disorder). It is a common reason for behavior and academic problems in school. CAUSES  The cause of ADHD is unknown in most cases. It may run in families. It sometimes can be associated with learning disabilities and other behavioral problems. SYMPTOMS  There are 3 types of ADHD. The 3 types and some of the symptoms include:  Inattentive  Gets bored or distracted easily.  Loses or forgets things. Forgets to hand in homework.  Has trouble organizing or completing tasks.  Difficulty staying on task.  An inability to organize daily tasks and school work.  Leaving projects, chores, or homework unfinished.  Trouble paying attention or responding to details. Careless mistakes.  Difficulty following directions. Often seems like is not listening.  Dislikes activities that require sustained attention (like chores or homework).  Hyperactive-impulsive  Feels like it is impossible to sit still or stay in a seat. Fidgeting with hands and feet.  Trouble waiting turn.  Talking too much or out of turn. Interruptive.  Speaks or acts impulsively.  Aggressive, disruptive behavior.  Constantly busy or on the go, noisy.  Combined  Has symptoms of both of the above. Often children with ADHD feel discouraged about themselves and with school. They often perform well below their abilities in school. These symptoms can cause problems in home, school, and in relationships with peers. As children get older, the excess motor activities can calm down, but the problems with paying attention and staying organized persist. Most children do not outgrow ADHD but with good treatment can learn to cope with the symptoms. DIAGNOSIS  When ADHD is suspected, the diagnosis should be made by professionals trained in ADHD.  Diagnosis will  include:  Ruling out other reasons for the child's behavior.  The caregivers will check with the child's school and check their medical records.  They will talk to teachers and parents.  Behavior rating scales for the child will be filled out by those dealing with the child on a daily basis. A diagnosis is made only after all information has been considered. TREATMENT  Treatment usually includes behavioral treatment often along with medicines. It may include stimulant medicines. The stimulant medicines decrease impulsivity and hyperactivity and increase attention. Other medicines used include antidepressants and certain blood pressure medicines. Most experts agree that treatment for ADHD should address all aspects of the child's functioning. Treatment should not be limited to the use of medicines alone. Treatment should include structured classroom management. The parents must receive education to address rewarding good behavior, discipline, and limit-setting. Tutoring or behavioral therapy or both should be available for the child. If untreated, the disorder can have long-term serious effects into adolescence and adulthood. HOME CARE INSTRUCTIONS   Often with ADHD there is a lot of frustration among the family in dealing with the illness. There is often blame and anger that is not warranted. This is a life long illness. There is no way to prevent ADHD. In many cases, because the problem affects the family as a whole, the entire family may need help. A therapist can help the family find better ways to handle the disruptive behaviors and promote change. If the child is young, most of the therapist's work is with the parents. Parents will learn techniques for coping with and improving their child's behavior. Sometimes only the child with the ADHD needs counseling. Your caregivers can help   you make these decisions.  Children with ADHD may need help in organizing. Some helpful tips include:  Keep  routines the same every day from wake-up time to bedtime. Schedule everything. This includes homework and playtime. This should include outdoor and indoor recreation. Keep the schedule on the refrigerator or a bulletin board where it is frequently seen. Mark schedule changes as far in advance as possible.  Have a place for everything and keep everything in its place. This includes clothing, backpacks, and school supplies.  Encourage writing down assignments and bringing home needed books.  Offer your child a well-balanced diet. Breakfast is especially important for school performance. Children should avoid drinks with caffeine including:  Soft drinks.  Coffee.  Tea.  However, some older children (adolescents) may find these drinks helpful in improving their attention.  Children with ADHD need consistent rules that they can understand and follow. If rules are followed, give small rewards. Children with ADHD often receive, and expect, criticism. Look for good behavior and praise it. Set realistic goals. Give clear instructions. Look for activities that can foster success and self-esteem. Make time for pleasant activities with your child. Give lots of affection.  Parents are their children's greatest advocates. Learn as much as possible about ADHD. This helps you become a stronger and better advocate for your child. It also helps you educate your child's teachers and instructors if they feel inadequate in these areas. Parent support groups are often helpful. A national group with local chapters is called CHADD (Children and Adults with Attention Deficit Hyperactivity Disorder). PROGNOSIS  There is no cure for ADHD. Children with the disorder seldom outgrow it. Many find adaptive ways to accommodate the ADHD as they mature. SEEK MEDICAL CARE IF:  Your child has repeated muscle twitches, cough or speech outbursts.  Your child has sleep problems.  Your child has a marked loss of  appetite.  Your child develops depression.  Your child has new or worsening behavioral problems.  Your child develops dizziness.  Your child has a racing heart.  Your child has stomach pains.  Your child develops headaches. Document Released: 06/22/2002 Document Revised: 09/24/2011 Document Reviewed: 02/02/2008 Exit[employer] Patient Information 2014 [employer].  

## 2013-01-16 ENCOUNTER — Encounter: Payer: Self-pay | Admitting: Family Medicine

## 2013-01-16 MED ORDER — AMPHETAMINE-DEXTROAMPHETAMINE 20 MG PO TABS: 20.0000 mg | ORAL_TABLET | Freq: Two times a day (BID) | ORAL | Status: DC

## 2013-01-16 NOTE — Assessment & Plan Note (Signed)
Inc adderall 20 mg 1 po bid  rto 6 months

## 2013-04-07 ENCOUNTER — Telehealth: Payer: Self-pay | Admitting: General Practice

## 2013-04-07 DIAGNOSIS — F988 Other specified behavioral and emotional disorders with onset usually occurring in childhood and adolescence: Secondary | ICD-10-CM

## 2013-04-07 MED ORDER — AMPHETAMINE-DEXTROAMPHETAMINE 20 MG PO TABS: 20.0000 mg | ORAL_TABLET | Freq: Two times a day (BID) | ORAL | Status: DC

## 2013-04-07 NOTE — Telephone Encounter (Signed)
Refill for 3 months if pt would like

## 2013-04-07 NOTE — Telephone Encounter (Signed)
amphetamine-dextroamphetamine (ADDERALL) 20 MG tablet Last OV 01-15-13 Med filled 01-16-13 #180  Low Risk

## 2013-04-07 NOTE — Telephone Encounter (Signed)
Msg left advising Rx ready for pick up.      KP 

## 2013-04-09 ENCOUNTER — Other Ambulatory Visit (INDEPENDENT_AMBULATORY_CARE_PROVIDER_SITE_OTHER): Payer: 59 | Admitting: *Deleted

## 2013-04-09 ENCOUNTER — Ambulatory Visit (INDEPENDENT_AMBULATORY_CARE_PROVIDER_SITE_OTHER): Payer: 59 | Admitting: *Deleted

## 2013-04-09 DIAGNOSIS — Z23 Encounter for immunization: Secondary | ICD-10-CM

## 2013-04-09 DIAGNOSIS — A184 Tuberculosis of skin and subcutaneous tissue: Secondary | ICD-10-CM

## 2013-04-09 DIAGNOSIS — IMO0001 Reserved for inherently not codable concepts without codable children: Secondary | ICD-10-CM

## 2013-05-16 ENCOUNTER — Other Ambulatory Visit: Payer: Self-pay | Admitting: Family Medicine

## 2013-06-25 ENCOUNTER — Other Ambulatory Visit: Payer: Self-pay | Admitting: *Deleted

## 2013-06-25 DIAGNOSIS — F988 Other specified behavioral and emotional disorders with onset usually occurring in childhood and adolescence: Secondary | ICD-10-CM

## 2013-06-25 MED ORDER — AMPHETAMINE-DEXTROAMPHETAMINE 20 MG PO TABS: 20.0000 mg | ORAL_TABLET | Freq: Two times a day (BID) | ORAL | Status: DC

## 2013-06-25 NOTE — Telephone Encounter (Signed)
Last seen-01/15/2013  Last filled-04/07/2013, 3 month supply  [facility]DS-10/24/2012 low risk, contract signed   Please advise. SW

## 2013-08-14 ENCOUNTER — Other Ambulatory Visit: Payer: Self-pay | Admitting: Family Medicine

## 2013-08-14 NOTE — Telephone Encounter (Signed)
Last seen 01/15/13 and Labs done on 01/29/12. No pending apt. Please advise      KP

## 2013-09-07 ENCOUNTER — Ambulatory Visit (INDEPENDENT_AMBULATORY_CARE_PROVIDER_SITE_OTHER): Payer: 59 | Admitting: Physician Assistant

## 2013-09-07 ENCOUNTER — Encounter: Payer: Self-pay | Admitting: Physician Assistant

## 2013-09-07 VITALS — BP 140/93 | HR 78 | Temp 99.0°F | Wt 157.0 lb

## 2013-09-07 DIAGNOSIS — J069 Acute upper respiratory infection, unspecified: Secondary | ICD-10-CM | POA: Insufficient documentation

## 2013-09-07 DIAGNOSIS — J029 Acute pharyngitis, unspecified: Secondary | ICD-10-CM

## 2013-09-07 DIAGNOSIS — B9789 Other viral agents as the cause of diseases classified elsewhere: Principal | ICD-10-CM

## 2013-09-07 LAB — POCT RAPID STREP A (OFFICE): Rapid Strep A Screen: NEGATIVE

## 2013-09-07 NOTE — Progress Notes (Signed)
Pre-visit discussion using our clinic review tool. No additional management support is needed unless otherwise documented below in the visit note.  

## 2013-09-07 NOTE — Patient Instructions (Signed)
Please increase fluid intake.  Rest.  Use saline nasal spray.  Place a humidifier in the bedroom.  Alternate tylenol and ibuprofen for sore throat and fever.  Use Mucinex.  I will call you with the results of your strep culture.  Please call the [facility] office [phone] if your symptoms are not improving in 2-3 days.  Viral Infections A virus is a type of germ. Viruses can cause:  Minor sore throats.  Aches and pains.  Headaches.  Runny nose.  Rashes.  Watery eyes.  Tiredness.  Coughs.  Loss of appetite.  Feeling sick to your stomach (nausea).  Throwing up (vomiting).  Watery poop (diarrhea). HOME CARE   Only take medicines as told by your doctor.  Drink enough water and fluids to keep your pee (urine) clear or pale yellow. Sports drinks are a good choice.  Get plenty of rest and eat healthy. Soups and broths with crackers or rice are fine. GET HELP RIGHT AWAY IF:   You have a very bad headache.  You have shortness of breath.  You have chest pain or neck pain.  You have an unusual rash.  You cannot stop throwing up.  You have watery poop that does not stop.  You cannot keep fluids down.  You or your child has a temperature by mouth above 102 F (38.9 C), not controlled by medicine.  Your baby is older than 3 months with a rectal temperature of 102 F (38.9 C) or higher.  Your baby is 643 months old or younger with a rectal temperature of 100.4 F (38 C) or higher. MAKE SURE YOU:   Understand these instructions.  Will watch this condition.  Will get help right away if you are not doing well or get worse. Document Released: 06/14/2008 Document Revised: 09/24/2011 Document Reviewed: 11/07/2010 [facility] Patient Information 2014 [place], [place].

## 2013-09-08 NOTE — Progress Notes (Signed)
Patient presents to clinic today c/o a couple of days congestion, sinus pressure, mild dry cough sore throat. Patient states that her sinus symptoms are improving, but her sore throat has worsened. Patient denies ear pain, choose pain, rash. Patient endorses sick contact with similar symptoms. Denies exposure to strep throat. Denies recent travel. Patient has been taking decongestants for her symptoms. BP was mildly elevated today. Patient with diagnoses of hypertension. Endorses taking her medications as prescribed. Hypertension is asymptomatic at present.  Past Medical History  Diagnosis Date  . Migraines     Hormonal  . Hypertension     Current Outpatient Prescriptions on File Prior to Visit  Medication Sig Dispense Refill  . amphetamine-dextroamphetamine (ADDERALL) 20 MG tablet Take 1 tablet (20 mg total) by mouth 2 (two) times daily.  180 tablet  0  . Calcium Carb-Cholecalciferol (CALCIUM 1000 + D PO) Take by mouth.      Marland Kitchen. glucosamine-chondroitin 500-400 MG tablet Take 1 tablet by mouth 3 (three) times daily.      . hydrochlorothiazide (HYDRODIURIL) 25 MG tablet 1 tab by mouth daily-- Labs are due now  90 tablet  0  . KRILL OIL 1000 MG CAPS Take 1 capsule by mouth daily.      . Multiple Vitamin (MULTIVITAMIN) tablet Take 1 tablet by mouth daily.       No current facility-administered medications on file prior to visit.    Allergies  Allergen Reactions  . Lisinopril Nausea And Vomiting    Family History  Problem Relation Age of Onset  . Hypertension Mother   . Diabetes Mother   . Emotional abuse Mother   . Alcohol abuse Mother     History   Social History  . Marital Status: Married    Spouse Name: N/A    Number of Children: N/A  . Years of Education: N/A   Social History Main Topics  . Smoking status: Never Smoker   . Smokeless tobacco: Never Used  . Alcohol Use: Yes     Comment: Rarely  . Drug Use: No  . Sexual Activity: None   Other Topics Concern  . None    Social History Narrative  . None   Review of Systems - See HPI.  All other ROS are negative.  BP 140/93  Pulse 78  Temp(Src) 99 F (37.2 C) (Oral)  Wt 157 lb (71.215 kg)  SpO2 97%  Physical Exam  Vitals reviewed. Constitutional: She is oriented to person, place, and time and well-developed, well-nourished, and in no distress.  HENT:  Head: Normocephalic and atraumatic.  Right Ear: Tympanic membrane and external ear normal.  Left Ear: Tympanic membrane and external ear normal.  Nose: Nose normal.  Mouth/Throat: Uvula is midline, oropharynx is clear and moist and mucous membranes are normal. No oropharyngeal exudate, posterior oropharyngeal edema, posterior oropharyngeal erythema or tonsillar abscesses.  Eyes: Pupils are equal, round, and reactive to light.  Neck: Neck supple.  Cardiovascular: Normal rate, regular rhythm, normal heart sounds and intact distal pulses.   Pulmonary/Chest: Effort normal and breath sounds normal. No respiratory distress. She has no wheezes. She has no rales. She exhibits no tenderness.  Lymphadenopathy:    She has no cervical adenopathy.  Neurological: She is alert and oriented to person, place, and time.  Skin: Skin is warm and dry. No rash noted.  Psychiatric: Affect normal.    Recent Results (from the past 2160 hour(s))  POCT RAPID STREP A (OFFICE)     Status: Normal  Collection Time    09/07/13  2:37 PM      Result Value Ref Range   Rapid Strep A Screen Negative  Negative    Assessment/Plan: Viral URI with cough Less than one week of symptoms. No symptoms are improving. Sore throat still present. Rapid strep is negative. We'll send for culture. Increase fluid intake. Rest. Saline nasal spray. Plain Mucinex for congestion. Avoid medication with decongestants due to history of hypertension. Place humidifier in the bedroom. Return to clinic if symptoms not improving.

## 2013-09-08 NOTE — Assessment & Plan Note (Signed)
Less than one week of symptoms. No symptoms are improving. Sore throat still present. Rapid strep is negative. We'll send for culture. Increase fluid intake. Rest. Saline nasal spray. Plain Mucinex for congestion. Avoid medication with decongestants due to history of hypertension. Place humidifier in the bedroom. Return to clinic if symptoms not improving.

## 2013-09-23 ENCOUNTER — Telehealth: Payer: Self-pay | Admitting: *Deleted

## 2013-09-23 NOTE — Telephone Encounter (Signed)
amphetamine-dextroamphetamine (ADDERALL) 20 MG tablet Last refill: 06/25/2013, #180 Last OV: 01/2013 with Dr. Laury AxonLowne, 08/2013 with Malva Coganody Martin, PA-C  UDS due, low risk

## 2013-09-24 ENCOUNTER — Other Ambulatory Visit: Payer: Self-pay | Admitting: *Deleted

## 2013-09-24 DIAGNOSIS — F988 Other specified behavioral and emotional disorders with onset usually occurring in childhood and adolescence: Secondary | ICD-10-CM

## 2013-09-24 MED ORDER — AMPHETAMINE-DEXTROAMPHETAMINE 20 MG PO TABS: 20.0000 mg | ORAL_TABLET | Freq: Two times a day (BID) | ORAL | Status: DC

## 2013-09-24 NOTE — Telephone Encounter (Signed)
Ok to refill but due for ov

## 2013-09-24 NOTE — Telephone Encounter (Signed)
Rx for adderall printed.  

## 2013-10-13 ENCOUNTER — Telehealth: Payer: Self-pay | Admitting: *Deleted

## 2013-10-13 DIAGNOSIS — F988 Other specified behavioral and emotional disorders with onset usually occurring in childhood and adolescence: Secondary | ICD-10-CM

## 2013-10-13 NOTE — Telephone Encounter (Signed)
Patient is requesting a refill on Adderall. (Pt was given script for #180 for mail order on 09/24/13 but took it to the local pharmacy that only filled #60) Last OV 01/15/2013 Last filled 09/24/13 Contract on file UDS 10/2012 low risk Okay to refill? (Pt is again requesting 90 day supply #180 to send to mail order)

## 2013-10-13 NOTE — Telephone Encounter (Signed)
Double check with pharmacy---- if only #90 filled-- ok to give #180

## 2013-10-14 MED ORDER — AMPHETAMINE-DEXTROAMPHETAMINE 20 MG PO TABS: 20.0000 mg | ORAL_TABLET | Freq: Two times a day (BID) | ORAL | Status: DC

## 2013-10-14 NOTE — Telephone Encounter (Signed)
Spoke with patient and made aware that prescription for adderall was ready to be picked up.

## 2013-10-14 NOTE — Telephone Encounter (Signed)
Rx printed for Adderall, placed on ledge for provider signature.

## 2013-10-16 ENCOUNTER — Other Ambulatory Visit: Payer: Self-pay | Admitting: Family Medicine

## 2013-10-19 NOTE — Telephone Encounter (Signed)
Letter mailed to schedule and Office visit.     KP

## 2013-10-21 ENCOUNTER — Ambulatory Visit: Payer: 59 | Admitting: Internal Medicine

## 2013-12-17 ENCOUNTER — Ambulatory Visit: Payer: 59 | Admitting: Family Medicine

## 2013-12-17 DIAGNOSIS — Z0289 Encounter for other administrative examinations: Secondary | ICD-10-CM

## 2013-12-21 ENCOUNTER — Telehealth: Payer: Self-pay | Admitting: Family Medicine

## 2013-12-21 DIAGNOSIS — F988 Other specified behavioral and emotional disorders with onset usually occurring in childhood and adolescence: Secondary | ICD-10-CM

## 2013-12-21 NOTE — Telephone Encounter (Signed)
Will refill 2 weeks or she can wait until ov

## 2013-12-21 NOTE — Telephone Encounter (Signed)
Patient no showed her last apt and is 5 months over due. Apt pending 12/25/13. Please advise     KP

## 2013-12-21 NOTE — Telephone Encounter (Signed)
Caller name:Regina [name]  Relation to KF:[id number]  Call back number: [phone] Pharmacy:  Reason for call: patient called to request a refill for Adderall

## 2013-12-22 MED ORDER — AMPHETAMINE-DEXTROAMPHETAMINE 20 MG PO TABS: 20.0000 mg | ORAL_TABLET | Freq: Two times a day (BID) | ORAL | Status: DC

## 2013-12-22 NOTE — Telephone Encounter (Signed)
Patient aware to pick up 2 weeks supply.      KP

## 2013-12-22 NOTE — Telephone Encounter (Signed)
Caller name: Nemiah  Call back number: [phone] Pharmacy: [facility] [address]  Reason for call:  Pt wants to have a 2 week supply of Adderall called into the pharmacy.

## 2013-12-22 NOTE — Telephone Encounter (Signed)
Detailed msg left with the below information requesting a call back with her decision.     KP

## 2013-12-23 ENCOUNTER — Other Ambulatory Visit: Payer: Self-pay | Admitting: Family Medicine

## 2013-12-24 ENCOUNTER — Telehealth: Payer: Self-pay | Admitting: Family Medicine

## 2013-12-24 NOTE — Telephone Encounter (Signed)
Caller name: Maia Breslow Relation to pt: pharmacy tech Call back number: [phone] Pharmacy: [facility] [address]   Reason for call: [facility] pharmacy tech called to see if it was okay to refill Regina [name] adderall medication a month early. Please advise.

## 2013-12-24 NOTE — Telephone Encounter (Signed)
Spoke with pharmacy and they actually filled a 90 day supply on 10/14/13.  Maia Breslow said the patient should have enough to last her through 01/13/14. I advised to destroy the Rx and I will notify the provider. If the patient has any questions she can call the office. Dr.Lowne has been made aware and agreed.     KP

## 2013-12-25 ENCOUNTER — Encounter: Payer: Self-pay | Admitting: Family Medicine

## 2013-12-25 ENCOUNTER — Ambulatory Visit (INDEPENDENT_AMBULATORY_CARE_PROVIDER_SITE_OTHER): Payer: 59 | Admitting: Family Medicine

## 2013-12-25 VITALS — BP 116/60 | HR 60 | Temp 98.0°F | Wt 144.0 lb

## 2013-12-25 DIAGNOSIS — F988 Other specified behavioral and emotional disorders with onset usually occurring in childhood and adolescence: Secondary | ICD-10-CM

## 2013-12-25 DIAGNOSIS — I10 Essential (primary) hypertension: Secondary | ICD-10-CM

## 2013-12-25 MED ORDER — AMLODIPINE BESYLATE 2.5 MG PO TABS: 2.5000 mg | ORAL_TABLET | Freq: Every day | ORAL | Status: DC

## 2013-12-25 MED ORDER — AMPHETAMINE-DEXTROAMPHETAMINE 20 MG PO TABS: 20.0000 mg | ORAL_TABLET | Freq: Two times a day (BID) | ORAL | Status: DC

## 2013-12-25 NOTE — Patient Instructions (Signed)
Attention Deficit Hyperactivity Disorder Attention deficit hyperactivity disorder (ADHD) is a problem with behavior issues based on the way the brain functions (neurobehavioral disorder). It is a common reason for behavior and academic problems in school. SYMPTOMS  There are 3 types of ADHD. The 3 types and some of the symptoms include:  Inattentive  Gets bored or distracted easily.  Loses or forgets things. Forgets to hand in homework.  Has trouble organizing or completing tasks.  Difficulty staying on task.  An inability to organize daily tasks and school work.  Leaving projects, chores, or homework unfinished.  Trouble paying attention or responding to details. Careless mistakes.  Difficulty following directions. Often seems like is not listening.  Dislikes activities that require sustained attention (like chores or homework).  Hyperactive-impulsive  Feels like it is impossible to sit still or stay in a seat. Fidgeting with hands and feet.  Trouble waiting turn.  Talking too much or out of turn. Interruptive.  Speaks or acts impulsively.  Aggressive, disruptive behavior.  Constantly busy or on the go, noisy.  Often leaves seat when they are expected to remain seated.  Often runs or climbs where it is not appropriate, or feels very restless.  Combined  Has symptoms of both of the above. Often children with ADHD feel discouraged about themselves and with school. They often perform well below their abilities in school. As children get older, the excess motor activities can calm down, but the problems with paying attention and staying organized persist. Most children do not outgrow ADHD but with good treatment can learn to cope with the symptoms. DIAGNOSIS  When ADHD is suspected, the diagnosis should be made by professionals trained in ADHD. This professional will collect information about the individual suspected of having ADHD. Information must be collected from  various settings where the person lives, works, or attends school.  Diagnosis will include:  Confirming symptoms began in childhood.  Ruling out other reasons for the child's behavior.  The health care providers will check with the child's school and check their medical records.  They will talk to teachers and parents.  Behavior rating scales for the child will be filled out by those dealing with the child on a daily basis. A diagnosis is made only after all information has been considered. TREATMENT  Treatment usually includes behavioral treatment, tutoring or extra support in school, and stimulant medicines. Because of the way a person's brain works with ADHD, these medicines decrease impulsivity and hyperactivity and increase attention. This is different than how they would work in a person who does not have ADHD. Other medicines used include antidepressants and certain blood pressure medicines. Most experts agree that treatment for ADHD should address all aspects of the person's functioning. Along with medicines, treatment should include structured classroom management at school. Parents should reward good behavior, provide constant discipline, and limit-setting. Tutoring should be available for the child as needed. ADHD is a life-long condition. If untreated, the disorder can have long-term serious effects into adolescence and adulthood. HOME CARE INSTRUCTIONS   Often with ADHD there is a lot of frustration among family members dealing with the condition. Blame and anger are also feelings that are common. In many cases, because the problem affects the family as a whole, the entire family may need help. A therapist can help the family find better ways to handle the disruptive behaviors of the person with ADHD and promote change. If the person with ADHD is young, most of the therapist's work   is with the parents. Parents will learn techniques for coping with and improving their child's  behavior. Sometimes only the child with the ADHD needs counseling. Your health care providers can help you make these decisions.  Children with ADHD may need help learning how to organize. Some helpful tips include:  Keep routines the same every day from wake-up time to bedtime. Schedule all activities, including homework and playtime. Keep the schedule in a place where the person with ADHD will often see it. Mark schedule changes as far in advance as possible.  Schedule outdoor and indoor recreation.  Have a place for everything and keep everything in its place. This includes clothing, backpacks, and school supplies.  Encourage writing down assignments and bringing home needed books. Work with your child's teachers for assistance in organizing school work.  Offer your child a well-balanced diet. Breakfast that includes a balance of whole grains, protein and, fruits or vegetables is especially important for school performance. Children should avoid drinks with caffeine including:  Soft drinks.  Coffee.  Tea.  However, some older children (adolescents) may find these drinks helpful in improving their attention. Because it can also be common for adolescents with ADHD to become addicted to caffeine, talk with your health care provider about what is a safe amount of caffeine intake for your child.  Children with ADHD need consistent rules that they can understand and follow. If rules are followed, give small rewards. Children with ADHD often receive, and expect, criticism. Look for good behavior and praise it. Set realistic goals. Give clear instructions. Look for activities that can foster success and self-esteem. Make time for pleasant activities with your child. Give lots of affection.  Parents are their children's greatest advocates. Learn as much as possible about ADHD. This helps you become a stronger and better advocate for your child. It also helps you educate your child's teachers and  instructors if they feel inadequate in these areas. Parent support groups are often helpful. A national group with local chapters is called Children and Adults with Attention Deficit Hyperactivity Disorder (CHADD). SEEK MEDICAL CARE IF:  Your child has repeated muscle twitches, cough or speech outbursts.  Your child has sleep problems.  Your child has a marked loss of appetite.  Your child develops depression.  Your child has new or worsening behavioral problems.  Your child develops dizziness.  Your child has a racing heart.  Your child has stomach pains.  Your child develops headaches. SEEK IMMEDIATE MEDICAL CARE IF:  Your child has been diagnosed with depression or anxiety and the symptoms seem to be getting worse.  Your child has been depressed and suddenly appears to have increased energy or motivation.  You are worried that your child is having a bad reaction to a medication he or she is taking for ADHD. Document Released: 06/22/2002 Document Revised: 04/22/2013 Document Reviewed: 03/09/2013 Exit[employer] Patient Information 2014 [employer].  

## 2013-12-25 NOTE — Progress Notes (Signed)
Pre-visit discussion using our clinic review tool. No additional management support is needed unless otherwise documented below in the visit note.  

## 2013-12-25 NOTE — Progress Notes (Signed)
   Subjective:    Patient ID: Regina HongStephanie C Santoli, female    DOB: [birth year], 48 y.o.   MRN: [id number]  HPI Pt here f/u add. 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     Review of Systems As above    Objective:   Physical Exam  BP 116/60  Pulse 60  Temp(Src) 98 F (36.7 C) (Oral)  Wt 144 lb (65.318 kg)  SpO2 98% General appearance: alert, cooperative, appears stated age and no distress Neck: no adenopathy, supple, symmetrical, trachea midline and thyroid not enlarged, symmetric, no tenderness/mass/nodules Lungs: clear to auscultation bilaterally Heart: regular rate and rhythm, S1, S2 normal, no murmur, click, rub or gallop Extremities: extremities normal, atraumatic, no cyanosis or edema      Assessment & Plan:  1. ADD (attention deficit disorder) Stable, con't meds - amphetamine-dextroamphetamine (ADDERALL) 20 MG tablet; Take 1 tablet (20 mg total) by mouth 2 (two) times daily.  Dispense: 180 tablet; Refill: 0  2. HTN (hypertension) stable - amLODipine (NORVASC) 2.5 MG tablet; Take 1 tablet (2.5 mg total) by mouth daily.  Dispense: 90 tablet; Refill: 3

## 2013-12-26 ENCOUNTER — Telehealth: Payer: Self-pay | Admitting: Family Medicine

## 2013-12-26 NOTE — Telephone Encounter (Signed)
Relevant patient education assigned to patient using Emmi. ° °

## 2014-03-06 ENCOUNTER — Other Ambulatory Visit: Payer: Self-pay | Admitting: Family Medicine

## 2014-03-09 ENCOUNTER — Telehealth: Payer: Self-pay

## 2014-03-09 NOTE — Telephone Encounter (Signed)
Spoke with patient and made her aware we do not have a full immunization record and she said she needed the complete record, she will have to call the school to see if she can get it.     KP

## 2014-03-09 NOTE — Telephone Encounter (Signed)
Pedro called and she needs a copy of her immunizations as soon as possible. She would like to pick up today.  Call [phone]

## 2014-03-29 ENCOUNTER — Telehealth: Payer: Self-pay | Admitting: Family Medicine

## 2014-03-29 DIAGNOSIS — F988 Other specified behavioral and emotional disorders with onset usually occurring in childhood and adolescence: Secondary | ICD-10-CM

## 2014-03-29 MED ORDER — AMPHETAMINE-DEXTROAMPHETAMINE 20 MG PO TABS: 20.0000 mg | ORAL_TABLET | Freq: Two times a day (BID) | ORAL | Status: DC

## 2014-03-29 NOTE — Telephone Encounter (Signed)
Caller name: Jeni Relation to pt: self Call back number: [phone] Pharmacy:  Reason for call:   Patient is requesting a new adderall rx

## 2014-03-29 NOTE — Telephone Encounter (Signed)
Msg left advising Rx ready for pick up.      KP 

## 2014-05-17 ENCOUNTER — Other Ambulatory Visit: Payer: Self-pay | Admitting: Family Medicine

## 2014-06-25 ENCOUNTER — Telehealth: Payer: Self-pay | Admitting: *Deleted

## 2014-06-25 ENCOUNTER — Ambulatory Visit: Payer: 59 | Admitting: Family Medicine

## 2014-06-25 NOTE — Telephone Encounter (Signed)
Pt did not show for appointment 06/25/2014 at 9am for 6 month follow up.  Pt called day of appointment at 3:22pm and rescheduled for 07/13/2014

## 2014-06-28 NOTE — Telephone Encounter (Signed)
Were our phones fixed by then.  Call pt first and see why----if no reason -- -charge

## 2014-06-28 NOTE — Telephone Encounter (Signed)
See note below to charge per provider

## 2014-07-06 ENCOUNTER — Ambulatory Visit (INDEPENDENT_AMBULATORY_CARE_PROVIDER_SITE_OTHER): Payer: 59 | Admitting: Family Medicine

## 2014-07-06 ENCOUNTER — Encounter: Payer: Self-pay | Admitting: Family Medicine

## 2014-07-06 VITALS — BP 128/76 | HR 78 | Temp 98.4°F | Wt 145.8 lb

## 2014-07-06 DIAGNOSIS — F988 Other specified behavioral and emotional disorders with onset usually occurring in childhood and adolescence: Secondary | ICD-10-CM

## 2014-07-06 DIAGNOSIS — F909 Attention-deficit hyperactivity disorder, unspecified type: Secondary | ICD-10-CM

## 2014-07-06 MED ORDER — AMPHETAMINE-DEXTROAMPHETAMINE 20 MG PO TABS
20.0000 mg | ORAL_TABLET | Freq: Two times a day (BID) | ORAL | Status: DC
Start: 2014-07-06 — End: 2014-07-06

## 2014-07-06 MED ORDER — AMPHETAMINE-DEXTROAMPHETAMINE 20 MG PO TABS: 20.0000 mg | ORAL_TABLET | Freq: Two times a day (BID) | ORAL | Status: DC

## 2014-07-06 NOTE — Patient Instructions (Signed)

## 2014-07-06 NOTE — Progress Notes (Signed)
   Subjective:    Patient ID: Regina [name], female    DOB: 07/03/1966, 48 y.o.   MRN: [id number]  HPI Pt here f/u ADD.  Doing well with meds-adderall.  Has a break from school.  No complaints.    Review of Systems  Constitutional: Negative for activity change, appetite change and unexpected weight change.  Psychiatric/Behavioral: Negative for hallucinations, behavioral problems, confusion, sleep disturbance, dysphoric mood, decreased concentration and agitation. The patient is not nervous/anxious and is not hyperactive.        Objective:   Physical Exam BP 128/76 mmHg  Pulse 78  Temp(Src) 98.4 F (36.9 C) (Oral)  Wt 145 lb 12.8 oz (66.134 kg)  SpO2 100% General appearance: alert, cooperative, appears stated age and no distress Neck: no adenopathy, supple, symmetrical, trachea midline and thyroid not enlarged, symmetric, no tenderness/mass/nodules Lungs: clear to auscultation bilaterally Heart: S1, S2 normal Extremities: extremities normal, atraumatic, no cyanosis or edema Neurologic: Alert and oriented X 3, normal strength and tone. Normal symmetric reflexes. Normal coordination and gait       Assessment & Plan:  1. ADD (attention deficit disorder) Stable, con't meds, rto 6 months - amphetamine-dextroamphetamine (ADDERALL) 20 MG tablet; Take 1 tablet (20 mg total) by mouth 2 (two) times daily.  Dispense: 180 tablet; Refill: 0

## 2014-07-06 NOTE — Progress Notes (Signed)
Pre visit review using our clinic review tool, if applicable. No additional management support is needed unless otherwise documented below in the visit note. 

## 2014-07-13 ENCOUNTER — Ambulatory Visit: Payer: 59 | Admitting: Family Medicine

## 2014-09-23 ENCOUNTER — Telehealth: Payer: Self-pay | Admitting: Family Medicine

## 2014-09-23 DIAGNOSIS — F988 Other specified behavioral and emotional disorders with onset usually occurring in childhood and adolescence: Secondary | ICD-10-CM

## 2014-09-23 NOTE — Telephone Encounter (Signed)
Ok for #60- if she wants more than 1 month, she'll have to wait for PCP

## 2014-09-23 NOTE — Telephone Encounter (Signed)
Caller name: Qamar Relation to pt: self Call back number: [phone] Pharmacy:  Reason for call:   Requesting adderall rx.

## 2014-09-23 NOTE — Telephone Encounter (Signed)
Last filled: 07/06/14 Amt: 180, 0 refills Last OV:  07/06/14  Please advise.

## 2014-09-24 MED ORDER — AMPHETAMINE-DEXTROAMPHETAMINE 20 MG PO TABS
20.0000 mg | ORAL_TABLET | Freq: Two times a day (BID) | ORAL | Status: DC
Start: 2014-09-24 — End: 2014-11-16

## 2014-09-24 NOTE — Telephone Encounter (Signed)
Rx printed and placed in Dr. Tabori's red folder for review and signature.   

## 2014-11-16 ENCOUNTER — Encounter: Payer: Self-pay | Admitting: Family Medicine

## 2014-11-16 ENCOUNTER — Ambulatory Visit (INDEPENDENT_AMBULATORY_CARE_PROVIDER_SITE_OTHER): Payer: 59 | Admitting: Family Medicine

## 2014-11-16 VITALS — BP 116/77 | HR 79 | Temp 98.1°F | Wt 147.2 lb

## 2014-11-16 DIAGNOSIS — F909 Attention-deficit hyperactivity disorder, unspecified type: Secondary | ICD-10-CM | POA: Diagnosis not present

## 2014-11-16 DIAGNOSIS — F988 Other specified behavioral and emotional disorders with onset usually occurring in childhood and adolescence: Secondary | ICD-10-CM

## 2014-11-16 MED ORDER — AMPHETAMINE-DEXTROAMPHETAMINE 20 MG PO TABS: 20.0000 mg | ORAL_TABLET | Freq: Two times a day (BID) | ORAL | Status: DC

## 2014-11-16 NOTE — Patient Instructions (Signed)

## 2014-11-16 NOTE — Assessment & Plan Note (Signed)
adderall refilled rto 6 months

## 2014-11-16 NOTE — Progress Notes (Signed)
Subjective:    Patient ID: Regina HongStephanie C [name], female    DOB: Jul 19, 1965, 49 y.o.   MRN: [id number]  HPI  Patient here for f/u add.  No complaints.     Past Medical History  Diagnosis Date  . Migraines     Hormonal  . Hypertension     Review of Systems  Constitutional: Negative for activity change, appetite change, fatigue and unexpected weight change.  Respiratory: Negative for cough and shortness of breath.   Cardiovascular: Negative for chest pain and palpitations.  Psychiatric/Behavioral: Negative for behavioral problems and dysphoric mood. The patient is not nervous/anxious.     Current Outpatient Prescriptions on File Prior to Visit  Medication Sig Dispense Refill  . amLODipine (NORVASC) 2.5 MG tablet Take 1 tablet (2.5 mg total) by mouth daily. 90 tablet 3  . Calcium Carb-Cholecalciferol (CALCIUM 1000 + D PO) Take by mouth.    . estradiol (ESTRACE) 0.1 MG/GM vaginal cream Place 1 Applicatorful vaginally at bedtime. 0.3 mg/gm vaginally at bedtime    . hydrochlorothiazide (HYDRODIURIL) 25 MG tablet Take 1 tablet by mouth  daily 90 tablet 0  . KRILL OIL 1000 MG CAPS Take 1 capsule by mouth daily.    . Lutein-Zeaxanthin 15-0.7 MG CAPS Take by mouth.    . Misc Natural Products (ESTROVEN + ENERGY MAX STRENGTH) TABS Take 1 tablet by mouth daily.    . Multiple Vitamin (MULTIVITAMIN) tablet Take 1 tablet by mouth daily.     No current facility-administered medications on file prior to visit.       Objective:    Physical Exam  Constitutional: She is oriented to person, place, and time. She appears well-developed and well-nourished. No distress.  HENT:  Right Ear: External ear normal.  Left Ear: External ear normal.  Nose: Nose normal.  Mouth/Throat: Oropharynx is clear and moist.  Eyes: EOM are normal. Pupils are equal, round, and reactive to light.  Neck: Normal range of motion. Neck supple.  Cardiovascular: Normal rate, regular rhythm and normal heart sounds.   No  murmur heard. Pulmonary/Chest: Effort normal and breath sounds normal. No respiratory distress. She has no wheezes. She has no rales. She exhibits no tenderness.  Neurological: She is alert and oriented to person, place, and time.  Psychiatric: She has a normal mood and affect. Her behavior is normal. Judgment and thought content normal.    BP 116/77 mmHg  Pulse 79  Temp(Src) 98.1 F (36.7 C) (Oral)  Wt 147 lb 3.2 oz (66.769 kg)  SpO2 100% Wt Readings from Last 3 Encounters:  11/16/14 147 lb 3.2 oz (66.769 kg)  07/06/14 145 lb 12.8 oz (66.134 kg)  12/25/13 144 lb (65.318 kg)     Lab Results  Component Value Date   WBC 3.6* 01/29/2012   HGB 12.6 01/29/2012   HCT 37.9 01/29/2012   PLT 189.0 01/29/2012   GLUCOSE 91 01/29/2012   CHOL 168 01/29/2012   TRIG 70.0 01/29/2012   HDL 48.20 01/29/2012   LDLCALC 106* 01/29/2012   ALT 19 01/29/2012   AST 22 01/29/2012   NA 141 01/29/2012   K 3.2* 01/29/2012   CL 106 01/29/2012   CREATININE 0.9 01/29/2012   BUN 16 01/29/2012   CO2 27 01/29/2012   TSH 1.57 01/29/2012       Assessment & Plan:   Problem List Items Addressed This Visit    ADD (attention deficit disorder) - Primary    adderall refilled rto 6 months  Relevant Medications   amphetamine-dextroamphetamine (ADDERALL) 20 MG tablet      I am having Ms. Selvey maintain her Krill Oil, Calcium Carb-Cholecalciferol (CALCIUM 1000 + D PO), multivitamin, estradiol, amLODipine, hydrochlorothiazide, ESTROVEN + ENERGY MAX STRENGTH, Lutein-Zeaxanthin, and amphetamine-dextroamphetamine.  Meds ordered this encounter  Medications  . amphetamine-dextroamphetamine (ADDERALL) 20 MG tablet    Sig: Take 1 tablet (20 mg total) by mouth 2 (two) times daily.    Dispense:  180 tablet    Refill:  0     Loreen Freud, DO

## 2014-11-16 NOTE — Progress Notes (Signed)
Pre visit review using our clinic review tool, if applicable. No additional management support is needed unless otherwise documented below in the visit note. 

## 2014-12-16 ENCOUNTER — Other Ambulatory Visit: Payer: Self-pay | Admitting: Family Medicine

## 2014-12-28 ENCOUNTER — Telehealth: Payer: Self-pay | Admitting: Family Medicine

## 2014-12-28 DIAGNOSIS — Z0184 Encounter for antibody response examination: Secondary | ICD-10-CM

## 2014-12-28 NOTE — Telephone Encounter (Signed)
Relation to OE:UMPN  Call back number: [phone]    Reason for call:  Pt requesting urinalysis results please fax to [facility] fax # [phone]   Pt would like to schedule a nurse visit to have her versatile titer and TB please advise

## 2014-12-30 ENCOUNTER — Other Ambulatory Visit (INDEPENDENT_AMBULATORY_CARE_PROVIDER_SITE_OTHER): Payer: 59

## 2014-12-30 ENCOUNTER — Other Ambulatory Visit: Payer: 59

## 2014-12-30 ENCOUNTER — Ambulatory Visit: Payer: 59

## 2014-12-30 ENCOUNTER — Telehealth: Payer: Self-pay | Admitting: Family Medicine

## 2014-12-30 DIAGNOSIS — Z0184 Encounter for antibody response examination: Secondary | ICD-10-CM

## 2014-12-30 NOTE — Telephone Encounter (Signed)
I would wait until we talk to company to see what else can make morphine positive

## 2014-12-30 NOTE — Telephone Encounter (Signed)
Please advise      KP 

## 2014-12-30 NOTE — Telephone Encounter (Signed)
Caller name: Aspasia Relation to pt: Call back number: [phone] Pharmacy:  Reason for call:   Patient states that she tested positive for morphine use at last UDS but states that she has never taken morphine. She wants to know if she can take test again? Coming in today at 10am for lab appt.(other labs)

## 2014-12-30 NOTE — Telephone Encounter (Signed)
Called and spoke with the pt and she is needing the UDS results.  Informed the pt of the results and the note.  Pt stated that she has not ever taking Morphine.  Pt is requesting to repeat the UDS.  Informed the pt that I will send the request to Dr. Laury Axon.  Pt agreed.  Pt scheduled a lab and nurse visits for Thursday to have a TB skin test done and to have a Varicella Zoster Titer drawn.//AB/CMA

## 2014-12-30 NOTE — Telephone Encounter (Signed)
Called and [facility] @ 11:53am @ [phone]) asking the pt to RTC regarding results.//AB/CMA

## 2014-12-31 ENCOUNTER — Telehealth: Payer: Self-pay | Admitting: Family Medicine

## 2014-12-31 LAB — VARICELLA ZOSTER ANTIBODY, IGG: Varicella IgG: 1230 Index — ABNORMAL HIGH (ref <135.00)

## 2014-12-31 NOTE — Telephone Encounter (Signed)
Spoke with patient and she requested the resultls be faxed to [facility] at fax # [phone]. Results of Tox Screen faxed and Varicella result faxed.     KP

## 2014-12-31 NOTE — Telephone Encounter (Signed)
Caller name:Earma Relationship to patient:self  Can be reached:[phone] Pharmacy:  Reason for call:wants lab results from yesterday

## 2015-01-07 NOTE — Telephone Encounter (Addendum)
Patient called in stating that she wants 2nd result to be faxed as well. Requesting callback when sent.

## 2015-01-10 NOTE — Telephone Encounter (Signed)
Urine results faxed to the number below.//AB/CMA

## 2015-01-10 NOTE — Telephone Encounter (Signed)
MSG left advising results will be faxed.     KP

## 2015-01-10 NOTE — Telephone Encounter (Signed)
Pt needs results from 2nd urine drug screen. Please send to [facility] fax # [phone]. Pt would also like a copy mailed to her, verified home address. Please call pt is not able to send to her.  Her phone # is

## 2015-01-10 NOTE — Telephone Encounter (Signed)
Her phone # is [phone]

## 2015-01-10 NOTE — Telephone Encounter (Signed)
Pt was aware of the note and agreed.//AB/CMA

## 2015-01-11 NOTE — Telephone Encounter (Signed)
UDS from 12/31/14 has been faxed.     KP

## 2015-01-26 ENCOUNTER — Telehealth: Payer: Self-pay | Admitting: Family Medicine

## 2015-01-26 NOTE — Telephone Encounter (Signed)
Caller name:Summerlynn Relation to pt:  Call back number: [phone] Pharmacy:  Reason for call:   Patient states that she never did receive lab results through the mail and is requesting this to be re mail.

## 2015-01-26 NOTE — Telephone Encounter (Signed)
Varicella results mailed.     KP

## 2015-02-07 ENCOUNTER — Other Ambulatory Visit: Payer: Self-pay | Admitting: Family Medicine

## 2015-02-11 ENCOUNTER — Telehealth: Payer: Self-pay | Admitting: Family Medicine

## 2015-02-11 MED ORDER — AMPHETAMINE-DEXTROAMPHETAMINE 10 MG PO TABS: ORAL_TABLET | ORAL | Status: DC

## 2015-02-11 NOTE — Telephone Encounter (Signed)
adderall 10 mg  #3 po qam and 2 po q afternoon  #150

## 2015-02-11 NOTE — Telephone Encounter (Signed)
Caller name: Myeisha Kruser Relationship to patient: self Can be reached: [phone]  Reason for call: Pt asking if she can get adderall rx's change. She is currently taking  in the morning and  in the afternoon. She is asking for  in the morning and  in the afternoon. Please call pt to advise. She has 10 days on hand right now.

## 2015-02-11 NOTE — Telephone Encounter (Signed)
Please advise      KP 

## 2015-02-11 NOTE — Telephone Encounter (Signed)
MSG left advising Rx ready for pick up.     KP 

## 2015-02-16 ENCOUNTER — Encounter: Payer: Self-pay | Admitting: *Deleted

## 2015-03-31 ENCOUNTER — Telehealth: Payer: Self-pay | Admitting: Family Medicine

## 2015-03-31 NOTE — Telephone Encounter (Signed)
Reason for call: Pt needing refill on adderall. She is requesting to get adderall  one in the morning and adderall  two in the afternoon. Please notify pt when ready for pick up best # [phone].

## 2015-04-01 MED ORDER — AMPHETAMINE-DEXTROAMPHETAMINE 10 MG PO TABS: ORAL_TABLET | ORAL | Status: DC

## 2015-04-01 NOTE — Telephone Encounter (Signed)
Patient wants to change the Rx to two separate prescriptions once for a 30 mg table for the morning and and a separate 20 mg in the afternoon. I advised Dr.Lowne is out of the office and she may have to wait until Monday when Dr.Lowne is back int he office and she is ok with that. Please advise     KP

## 2015-04-01 NOTE — Telephone Encounter (Signed)
See rx. 

## 2015-04-01 NOTE — Telephone Encounter (Signed)
The script approved and signed by Efraim Kaufmann has been discarded.     KP

## 2015-04-01 NOTE — Telephone Encounter (Signed)
Last seen 11/16/14 and filled 02/11/15 #150 UDS 12/28/14 low risk   Please advise     KP

## 2015-04-04 MED ORDER — AMPHETAMINE-DEXTROAMPHETAMINE 20 MG PO TABS: ORAL_TABLET | ORAL | Status: DC

## 2015-04-04 MED ORDER — AMPHETAMINE-DEXTROAMPHET ER 30 MG PO CP24: 30.0000 mg | ORAL_CAPSULE | ORAL | Status: DC

## 2015-04-04 NOTE — Telephone Encounter (Signed)
Left advising the Rx is ready for pick up.     KP

## 2015-04-04 NOTE — Telephone Encounter (Signed)
Ok to do 2 separate rx

## 2015-05-16 ENCOUNTER — Other Ambulatory Visit: Payer: Self-pay | Admitting: Family Medicine

## 2015-05-16 MED ORDER — AMPHETAMINE-DEXTROAMPHET ER 30 MG PO CP24: 30.0000 mg | ORAL_CAPSULE | ORAL | Status: DC

## 2015-05-16 MED ORDER — AMPHETAMINE-DEXTROAMPHETAMINE 20 MG PO TABS: ORAL_TABLET | ORAL | Status: DC

## 2015-05-17 ENCOUNTER — Encounter: Payer: Self-pay | Admitting: Family Medicine

## 2015-05-18 ENCOUNTER — Other Ambulatory Visit: Payer: Self-pay | Admitting: Family Medicine

## 2015-05-27 ENCOUNTER — Ambulatory Visit: Payer: 59 | Admitting: Family Medicine

## 2015-07-05 ENCOUNTER — Ambulatory Visit: Payer: 59 | Admitting: Family Medicine

## 2015-08-02 ENCOUNTER — Ambulatory Visit: Payer: 59 | Admitting: Family Medicine

## 2015-08-15 ENCOUNTER — Ambulatory Visit (INDEPENDENT_AMBULATORY_CARE_PROVIDER_SITE_OTHER): Payer: 59 | Admitting: Family Medicine

## 2015-08-15 ENCOUNTER — Encounter: Payer: Self-pay | Admitting: Family Medicine

## 2015-08-15 VITALS — BP 132/80 | HR 75 | Temp 98.2°F | Wt 154.0 lb

## 2015-08-15 DIAGNOSIS — F909 Attention-deficit hyperactivity disorder, unspecified type: Secondary | ICD-10-CM

## 2015-08-15 DIAGNOSIS — Z1211 Encounter for screening for malignant neoplasm of colon: Secondary | ICD-10-CM | POA: Diagnosis not present

## 2015-08-15 DIAGNOSIS — F988 Other specified behavioral and emotional disorders with onset usually occurring in childhood and adolescence: Secondary | ICD-10-CM

## 2015-08-15 MED ORDER — AMPHETAMINE-DEXTROAMPHET ER 30 MG PO CP24: 30.0000 mg | ORAL_CAPSULE | ORAL | Status: DC

## 2015-08-15 MED ORDER — AMPHETAMINE-DEXTROAMPHETAMINE 20 MG PO TABS: ORAL_TABLET | ORAL | Status: DC

## 2015-08-15 NOTE — Progress Notes (Signed)
Pre visit review using our clinic review tool, if applicable. No additional management support is needed unless otherwise documented below in the visit note. 

## 2015-08-15 NOTE — Progress Notes (Signed)
Patient ID: Regina [name], female    DOB: [birth year]  Age: 50 y.o. MRN: [id number]    Subjective:  Subjective HPI Regina [name] presents for f/u add. Pt is doing well with meds.  No complaints.   Review of Systems  Constitutional: Negative for diaphoresis, appetite change, fatigue and unexpected weight change.  Eyes: Negative for pain, redness and visual disturbance.  Respiratory: Negative for cough, chest tightness, shortness of breath and wheezing.   Cardiovascular: Negative for chest pain, palpitations and leg swelling.  Endocrine: Negative for cold intolerance, heat intolerance, polydipsia, polyphagia and polyuria.  Genitourinary: Negative for dysuria, frequency and difficulty urinating.  Neurological: Negative for dizziness, light-headedness, numbness and headaches.    History Past Medical History  Diagnosis Date  . Migraines     Hormonal  . Hypertension     She has past surgical history that includes Lyphoma.   Her family history includes Alcohol abuse in her mother; Diabetes in her mother; Emotional abuse in her mother; Hypertension in her mother.She reports that she has never smoked. She has never used smokeless tobacco. She reports that she drinks alcohol. She reports that she does not use illicit drugs.  Current Outpatient Prescriptions on File Prior to Visit  Medication Sig Dispense Refill  . amLODipine (NORVASC) 2.5 MG tablet Take 1 tablet by mouth  daily 90 tablet 1  . Calcium Carb-Cholecalciferol (CALCIUM 1000 + D PO) Take by mouth.    . hydrochlorothiazide (HYDRODIURIL) 25 MG tablet Take 1 tablet by mouth  daily 90 tablet 1  . KRILL OIL 1000 MG CAPS Take 1 capsule by mouth daily.    . Lutein-Zeaxanthin 15-0.7 MG CAPS Take by mouth.    . Multiple Vitamin (MULTIVITAMIN) tablet Take 1 tablet by mouth daily.     No current facility-administered medications on file prior to visit.     Objective:  Objective Physical Exam  Constitutional: She is  oriented to person, place, and time. She appears well-developed and well-nourished.  HENT:  Head: Normocephalic and atraumatic.  Eyes: Conjunctivae and EOM are normal.  Neck: Normal range of motion. Neck supple. No JVD present. Carotid bruit is not present. No thyromegaly present.  Cardiovascular: Normal rate, regular rhythm and normal heart sounds.   No murmur heard. Pulmonary/Chest: Effort normal and breath sounds normal. No respiratory distress. She has no wheezes. She has no rales. She exhibits no tenderness.  Musculoskeletal: She exhibits no edema.  Neurological: She is alert and oriented to person, place, and time.  Psychiatric: She has a normal mood and affect.  Nursing note and vitals reviewed.  BP 132/80 mmHg  Pulse 75  Temp(Src) 98.2 F (36.8 C) (Oral)  Wt 154 lb (69.854 kg)  SpO2 98% Wt Readings from Last 3 Encounters:  08/15/15 154 lb (69.854 kg)  11/16/14 147 lb 3.2 oz (66.769 kg)  07/06/14 145 lb 12.8 oz (66.134 kg)     Lab Results  Component Value Date   WBC 3.6* 01/29/2012   HGB 12.6 01/29/2012   HCT 37.9 01/29/2012   PLT 189.0 01/29/2012   GLUCOSE 91 01/29/2012   CHOL 168 01/29/2012   TRIG 70.0 01/29/2012   HDL 48.20 01/29/2012   LDLCALC 106* 01/29/2012   ALT 19 01/29/2012   AST 22 01/29/2012   NA 141 01/29/2012   K 3.2* 01/29/2012   CL 106 01/29/2012   CREATININE 0.9 01/29/2012   BUN 16 01/29/2012   CO2 27 01/29/2012   TSH 1.57 01/29/2012    Ct Head  Wo Contrast  05/20/2007  Clinical Data: 50 year-old with headache and high blood pressure. HEAD CT WITHOUT CONTRAST: Technique: Contiguous axial images were obtained from the base of the skull through the vertex according to standard protocol without contrast. Comparison: None. Findings: There is no evidence of intracranial hemorrhage, brain edema, acute infarct, mass lesion, or mass effect.  No other intra-axial abnormalities are seen, and the ventricles are within normal limits.  No abnormal extra-axial  fluid collections or masses are identified.  No skull abnormalities are noted. IMPRESSION: Negative non-contrast head CT.     Provider: Amaryllis Dyke    Assessment & Plan:  Plan I have discontinued [name]'s estradiol and ESTROVEN + ENERGY MAX STRENGTH. I am also having her maintain her Krill Oil, Calcium Carb-Cholecalciferol (CALCIUM 1000 + D PO), multivitamin, Lutein-Zeaxanthin, amLODipine, hydrochlorothiazide, ESTRING, amphetamine-dextroamphetamine, and amphetamine-dextroamphetamine.  Meds ordered this encounter  Medications  . ESTRING 2 MG vaginal ring    Sig: Place 1 Device vaginally every 3 (three) months.    Refill:  3  . amphetamine-dextroamphetamine (ADDERALL XR) 30 MG 24 hr capsule    Sig: Take 1 capsule (30 mg total) by mouth every morning.    Dispense:  30 capsule    Refill:  0  . amphetamine-dextroamphetamine (ADDERALL) 20 MG tablet    Sig: Take 1 tablet by mouth in the afternoon    Dispense:  30 tablet    Refill:  0    Problem List Items Addressed This Visit      Unprioritized   ADD (attention deficit disorder)   Relevant Medications   amphetamine-dextroamphetamine (ADDERALL XR) 30 MG 24 hr capsule   amphetamine-dextroamphetamine (ADDERALL) 20 MG tablet    Other Visit Diagnoses    Special screening for malignant neoplasms, colon    -  Primary    Relevant Orders    Ambulatory referral to Gastroenterology       Follow-up: Return in about 6 months (around 02/12/2016).  Loreen Freud, DO

## 2015-09-02 ENCOUNTER — Other Ambulatory Visit: Payer: Self-pay | Admitting: Family Medicine

## 2015-09-26 ENCOUNTER — Telehealth: Payer: Self-pay | Admitting: Family Medicine

## 2015-09-26 DIAGNOSIS — F988 Other specified behavioral and emotional disorders with onset usually occurring in childhood and adolescence: Secondary | ICD-10-CM

## 2015-09-26 MED ORDER — AMPHETAMINE-DEXTROAMPHET ER 30 MG PO CP24: 30.0000 mg | ORAL_CAPSULE | ORAL | Status: DC

## 2015-09-26 MED ORDER — AMPHETAMINE-DEXTROAMPHETAMINE 20 MG PO TABS: ORAL_TABLET | ORAL | Status: DC

## 2015-09-26 NOTE — Telephone Encounter (Signed)
VM left advising Rx will be ready after 10 am.    KP

## 2015-09-26 NOTE — Telephone Encounter (Signed)
Relationship to patient: Self   Can be reached: [phone]  Reason for call: Refill request on both Adderall Rx. Pt would like to get a 90 day supply if possible.    Thanks.

## 2015-12-06 ENCOUNTER — Other Ambulatory Visit: Payer: Self-pay | Admitting: Family Medicine

## 2016-01-02 ENCOUNTER — Telehealth: Payer: Self-pay | Admitting: Family Medicine

## 2016-01-02 DIAGNOSIS — F988 Other specified behavioral and emotional disorders with onset usually occurring in childhood and adolescence: Secondary | ICD-10-CM

## 2016-01-02 NOTE — Telephone Encounter (Signed)
Last OV 08/15/15 and she is requesting a 90 supply with no pending apt.  Last UDS 12/28/14 moderate risk  Please advise   KP

## 2016-01-02 NOTE — Telephone Encounter (Signed)
Can be reached: [phone]  Reason for call: Pt called for adderall refill. She has about 10 days. Needs 30mg  ER and 20mg  tablet - she needs both in 90 day supply - she will pick up RX but has to send to mail order.   Declined scheduling f/u for July at this time stating schedule is changing.

## 2016-01-03 NOTE — Telephone Encounter (Signed)
Message left to call the office.    KP 

## 2016-01-03 NOTE — Telephone Encounter (Signed)
She is due this month for ov-- can give 1 month

## 2016-01-04 ENCOUNTER — Other Ambulatory Visit: Payer: Self-pay | Admitting: Family Medicine

## 2016-01-05 NOTE — Telephone Encounter (Signed)
My-chart message sent      KP 

## 2016-01-11 MED ORDER — AMPHETAMINE-DEXTROAMPHETAMINE 20 MG PO TABS: ORAL_TABLET | ORAL | Status: DC

## 2016-01-11 MED ORDER — AMPHETAMINE-DEXTROAMPHET ER 30 MG PO CP24: 30.0000 mg | ORAL_CAPSULE | ORAL | Status: DC

## 2016-01-11 NOTE — Addendum Note (Signed)
Addended by: Mervin KungFERGERSON, Kaysie Michelini A on: 01/11/2016 01:22 PM   Modules accepted: Orders

## 2016-01-11 NOTE — Telephone Encounter (Signed)
Rx was not printed previously for the approved 30 day supply. Pt came in to the office to pick up Rx. Rx's printed and signed by NP, Peggyann Juba'Sullivan. Pt will schedule follow up for further refills.

## 2016-02-19 ENCOUNTER — Other Ambulatory Visit: Payer: Self-pay | Admitting: Family Medicine

## 2016-02-23 ENCOUNTER — Ambulatory Visit: Payer: 59 | Admitting: Family Medicine

## 2016-03-02 ENCOUNTER — Ambulatory Visit (INDEPENDENT_AMBULATORY_CARE_PROVIDER_SITE_OTHER): Payer: 59 | Admitting: Family Medicine

## 2016-03-02 ENCOUNTER — Encounter: Payer: Self-pay | Admitting: Family Medicine

## 2016-03-02 ENCOUNTER — Encounter: Payer: Self-pay | Admitting: Internal Medicine

## 2016-03-02 VITALS — BP 134/80 | HR 92 | Temp 98.1°F | Ht 66.0 in | Wt 149.8 lb

## 2016-03-02 DIAGNOSIS — I10 Essential (primary) hypertension: Secondary | ICD-10-CM

## 2016-03-02 DIAGNOSIS — F909 Attention-deficit hyperactivity disorder, unspecified type: Secondary | ICD-10-CM

## 2016-03-02 DIAGNOSIS — Z0000 Encounter for general adult medical examination without abnormal findings: Secondary | ICD-10-CM

## 2016-03-02 DIAGNOSIS — F988 Other specified behavioral and emotional disorders with onset usually occurring in childhood and adolescence: Secondary | ICD-10-CM

## 2016-03-02 LAB — CBC WITH DIFFERENTIAL/PLATELET
BASOS PCT: 0.9 % (ref 0.0–3.0)
Basophils Absolute: 0 10*3/uL (ref 0.0–0.1)
EOS ABS: 0.1 10*3/uL (ref 0.0–0.7)
Eosinophils Relative: 2.2 % (ref 0.0–5.0)
HEMATOCRIT: 40.4 % (ref 36.0–46.0)
HEMOGLOBIN: 13.6 g/dL (ref 12.0–15.0)
LYMPHS PCT: 43.5 % (ref 12.0–46.0)
Lymphs Abs: 2.3 10*3/uL (ref 0.7–4.0)
MCHC: 33.5 g/dL (ref 30.0–36.0)
MCV: 90.1 fl (ref 78.0–100.0)
MONOS PCT: 8.6 % (ref 3.0–12.0)
Monocytes Absolute: 0.5 10*3/uL (ref 0.1–1.0)
Neutro Abs: 2.4 10*3/uL (ref 1.4–7.7)
Neutrophils Relative %: 44.8 % (ref 43.0–77.0)
Platelets: 249 10*3/uL (ref 150.0–400.0)
RBC: 4.48 Mil/uL (ref 3.87–5.11)
RDW: 13.8 % (ref 11.5–15.5)
WBC: 5.4 10*3/uL (ref 4.0–10.5)

## 2016-03-02 LAB — POCT URINALYSIS DIPSTICK
BILIRUBIN UA: NEGATIVE
Blood, UA: NEGATIVE
GLUCOSE UA: NEGATIVE
Ketones, UA: NEGATIVE
LEUKOCYTES UA: NEGATIVE
NITRITE UA: NEGATIVE
PH UA: 6
Protein, UA: NEGATIVE
Spec Grav, UA: 1.02
Urobilinogen, UA: NEGATIVE

## 2016-03-02 LAB — COMPREHENSIVE METABOLIC PANEL
ALBUMIN: 4.5 g/dL (ref 3.5–5.2)
ALK PHOS: 78 U/L (ref 39–117)
ALT: 19 U/L (ref 0–35)
AST: 20 U/L (ref 0–37)
BILIRUBIN TOTAL: 0.5 mg/dL (ref 0.2–1.2)
BUN: 13 mg/dL (ref 6–23)
CHLORIDE: 105 meq/L (ref 96–112)
CO2: 33 mEq/L — ABNORMAL HIGH (ref 19–32)
CREATININE: 0.97 mg/dL (ref 0.40–1.20)
Calcium: 10.1 mg/dL (ref 8.4–10.5)
GFR: 64.21 mL/min (ref >60.00)
Glucose, Bld: 95 mg/dL (ref 70–99)
Potassium: 3.6 mEq/L (ref 3.5–5.1)
SODIUM: 143 meq/L (ref 135–145)
TOTAL PROTEIN: 7.2 g/dL (ref 6.0–8.3)

## 2016-03-02 LAB — LIPID PANEL
CHOLESTEROL: 180 mg/dL (ref 0–200)
HDL: 58 mg/dL (ref >39.00)
LDL CALC: 104 mg/dL — AB (ref 0–99)
NonHDL: 122
Total CHOL/HDL Ratio: 3
Triglycerides: 91 mg/dL (ref 0.0–149.0)
VLDL: 18.2 mg/dL (ref 0.0–40.0)

## 2016-03-02 LAB — TSH: TSH: 1.49 u[IU]/mL (ref 0.35–4.50)

## 2016-03-02 MED ORDER — AMPHETAMINE-DEXTROAMPHETAMINE 20 MG PO TABS: ORAL_TABLET | ORAL | 0 refills | Status: DC

## 2016-03-02 MED ORDER — HYDROCHLOROTHIAZIDE 25 MG PO TABS: 12.5000 mg | ORAL_TABLET | Freq: Every day | ORAL | 3 refills | Status: DC

## 2016-03-02 MED ORDER — AMPHETAMINE-DEXTROAMPHET ER 30 MG PO CP24: 30.0000 mg | ORAL_CAPSULE | ORAL | 0 refills | Status: DC

## 2016-03-02 NOTE — Patient Instructions (Signed)
Preventive Care for Adults, Female A healthy lifestyle and preventive care can promote health and wellness. Preventive health guidelines for women include the following key practices.  A routine yearly physical is a good way to check with your health care provider about your health and preventive screening. It is a chance to share any concerns and updates on your health and to receive a thorough exam.  Visit your dentist for a routine exam and preventive care every 6 months. Brush your teeth twice a day and floss once a day. Good oral hygiene prevents tooth decay and gum disease.  The frequency of eye exams is based on your age, health, family medical history, use of contact lenses, and other factors. Follow your health care provider's recommendations for frequency of eye exams.  Eat a healthy diet. Foods like vegetables, fruits, whole grains, low-fat dairy products, and lean protein foods contain the nutrients you need without too many calories. Decrease your intake of foods high in solid fats, added sugars, and salt. Eat the right amount of calories for you.Get information about a proper diet from your health care provider, if necessary.  Regular physical exercise is one of the most important things you can do for your health. Most adults should get at least 150 minutes of moderate-intensity exercise (any activity that increases your heart rate and causes you to sweat) each week. In addition, most adults need muscle-strengthening exercises on 2 or more days a week.  Maintain a healthy weight. The body mass index (BMI) is a screening tool to identify possible weight problems. It provides an estimate of body fat based on height and weight. Your health care provider can find your BMI and can help you achieve or maintain a healthy weight.For adults 20 years and older:  A BMI below 18.5 is considered underweight.  A BMI of 18.5 to 24.9 is normal.  A BMI of 25 to 29.9 is considered overweight.  A  BMI of 30 and above is considered obese.  Maintain normal blood lipids and cholesterol levels by exercising and minimizing your intake of saturated fat. Eat a balanced diet with plenty of fruit and vegetables. Blood tests for lipids and cholesterol should begin at age 45 and be repeated every 5 years. If your lipid or cholesterol levels are high, you are over 50, or you are at high risk for heart disease, you may need your cholesterol levels checked more frequently.Ongoing high lipid and cholesterol levels should be treated with medicines if diet and exercise are not working.  If you smoke, find out from your health care provider how to quit. If you do not use tobacco, do not start.  Lung cancer screening is recommended for adults aged 45-80 years who are at high risk for developing lung cancer because of a history of smoking. A yearly low-dose CT scan of the lungs is recommended for people who have at least a 30-pack-year history of smoking and are a current smoker or have quit within the past 15 years. A pack year of smoking is smoking an average of 1 pack of cigarettes a day for 1 year (for example: 1 pack a day for 30 years or 2 packs a day for 15 years). Yearly screening should continue until the smoker has stopped smoking for at least 15 years. Yearly screening should be stopped for people who develop a health problem that would prevent them from having lung cancer treatment.  If you are pregnant, do not drink alcohol. If you are  breastfeeding, be very cautious about drinking alcohol. If you are not pregnant and choose to drink alcohol, do not have more than 1 drink per day. One drink is considered to be 12 ounces (355 mL) of beer, 5 ounces (148 mL) of wine, or 1.5 ounces (44 mL) of liquor.  Avoid use of street drugs. Do not share needles with anyone. Ask for help if you need support or instructions about stopping the use of drugs.  High blood pressure causes heart disease and increases the risk  of stroke. Your blood pressure should be checked at least every 1 to 2 years. Ongoing high blood pressure should be treated with medicines if weight loss and exercise do not work.  If you are 55-79 years old, ask your health care provider if you should take aspirin to prevent strokes.  Diabetes screening is done by taking a blood sample to check your blood glucose level after you have not eaten for a certain period of time (fasting). If you are not overweight and you do not have risk factors for diabetes, you should be screened once every 3 years starting at age 45. If you are overweight or obese and you are 40-70 years of age, you should be screened for diabetes every year as part of your cardiovascular risk assessment.  Breast cancer screening is essential preventive care for women. You should practice "breast self-awareness." This means understanding the normal appearance and feel of your breasts and may include breast self-examination. Any changes detected, no matter how small, should be reported to a health care provider. Women in their 20s and 30s should have a clinical breast exam (CBE) by a health care provider as part of a regular health exam every 1 to 3 years. After age 40, women should have a CBE every year. Starting at age 40, women should consider having a mammogram (breast X-ray test) every year. Women who have a family history of breast cancer should talk to their health care provider about genetic screening. Women at a high risk of breast cancer should talk to their health care providers about having an MRI and a mammogram every year.  Breast cancer gene (BRCA)-related cancer risk assessment is recommended for women who have family members with BRCA-related cancers. BRCA-related cancers include breast, ovarian, tubal, and peritoneal cancers. Having family members with these cancers may be associated with an increased risk for harmful changes (mutations) in the breast cancer genes BRCA1 and  BRCA2. Results of the assessment will determine the need for genetic counseling and BRCA1 and BRCA2 testing.  Your health care provider may recommend that you be screened regularly for cancer of the pelvic organs (ovaries, uterus, and vagina). This screening involves a pelvic examination, including checking for microscopic changes to the surface of your cervix (Pap test). You may be encouraged to have this screening done every 3 years, beginning at age 21.  For women ages 30-65, health care providers may recommend pelvic exams and Pap testing every 3 years, or they may recommend the Pap and pelvic exam, combined with testing for human papilloma virus (HPV), every 5 years. Some types of HPV increase your risk of cervical cancer. Testing for HPV may also be done on women of any age with unclear Pap test results.  Other health care providers may not recommend any screening for nonpregnant women who are considered low risk for pelvic cancer and who do not have symptoms. Ask your health care provider if a screening pelvic exam is right for   you.  If you have had past treatment for cervical cancer or a condition that could lead to cancer, you need Pap tests and screening for cancer for at least 20 years after your treatment. If Pap tests have been discontinued, your risk factors (such as having a new sexual partner) need to be reassessed to determine if screening should resume. Some women have medical problems that increase the chance of getting cervical cancer. In these cases, your health care provider may recommend more frequent screening and Pap tests.  Colorectal cancer can be detected and often prevented. Most routine colorectal cancer screening begins at the age of 50 years and continues through age 75 years. However, your health care provider may recommend screening at an earlier age if you have risk factors for colon cancer. On a yearly basis, your health care provider may provide home test kits to check  for hidden blood in the stool. Use of a small camera at the end of a tube, to directly examine the colon (sigmoidoscopy or colonoscopy), can detect the earliest forms of colorectal cancer. Talk to your health care provider about this at age 50, when routine screening begins. Direct exam of the colon should be repeated every 5-10 years through age 75 years, unless early forms of precancerous polyps or small growths are found.  People who are at an increased risk for hepatitis B should be screened for this virus. You are considered at high risk for hepatitis B if:  You were born in a country where hepatitis B occurs often. Talk with your health care provider about which countries are considered high risk.  Your parents were born in a high-risk country and you have not received a shot to protect against hepatitis B (hepatitis B vaccine).  You have HIV or AIDS.  You use needles to inject street drugs.  You live with, or have sex with, someone who has hepatitis B.  You get hemodialysis treatment.  You take certain medicines for conditions like cancer, organ transplantation, and autoimmune conditions.  Hepatitis C blood testing is recommended for all people born from 1945 through 1965 and any individual with known risks for hepatitis C.  Practice safe sex. Use condoms and avoid high-risk sexual practices to reduce the spread of sexually transmitted infections (STIs). STIs include gonorrhea, chlamydia, syphilis, trichomonas, herpes, HPV, and human immunodeficiency virus (HIV). Herpes, HIV, and HPV are viral illnesses that have no cure. They can result in disability, cancer, and death.  You should be screened for sexually transmitted illnesses (STIs) including gonorrhea and chlamydia if:  You are sexually active and are younger than 24 years.  You are older than 24 years and your health care provider tells you that you are at risk for this type of infection.  Your sexual activity has changed  since you were last screened and you are at an increased risk for chlamydia or gonorrhea. Ask your health care provider if you are at risk.  If you are at risk of being infected with HIV, it is recommended that you take a prescription medicine daily to prevent HIV infection. This is called preexposure prophylaxis (PrEP). You are considered at risk if:  You are sexually active and do not regularly use condoms or know the HIV status of your partner(s).  You take drugs by injection.  You are sexually active with a partner who has HIV.  Talk with your health care provider about whether you are at high risk of being infected with HIV. If   you choose to begin PrEP, you should first be tested for HIV. You should then be tested every 3 months for as long as you are taking PrEP.  Osteoporosis is a disease in which the bones lose minerals and strength with aging. This can result in serious bone fractures or breaks. The risk of osteoporosis can be identified using a bone density scan. Women ages 67 years and over and women at risk for fractures or osteoporosis should discuss screening with their health care providers. Ask your health care provider whether you should take a calcium supplement or vitamin D to reduce the rate of osteoporosis.  Menopause can be associated with physical symptoms and risks. Hormone replacement therapy is available to decrease symptoms and risks. You should talk to your health care provider about whether hormone replacement therapy is right for you.  Use sunscreen. Apply sunscreen liberally and repeatedly throughout the day. You should seek shade when your shadow is shorter than you. Protect yourself by wearing long sleeves, pants, a wide-brimmed hat, and sunglasses year round, whenever you are outdoors.  Once a month, do a whole body skin exam, using a mirror to look at the skin on your back. Tell your health care provider of new moles, moles that have irregular borders, moles that  are larger than a pencil eraser, or moles that have changed in shape or color.  Stay current with required vaccines (immunizations).  Influenza vaccine. All adults should be immunized every year.  Tetanus, diphtheria, and acellular pertussis (Td, Tdap) vaccine. Pregnant women should receive 1 dose of Tdap vaccine during each pregnancy. The dose should be obtained regardless of the length of time since the last dose. Immunization is preferred during the 27th-36th week of gestation. An adult who has not previously received Tdap or who does not know her vaccine status should receive 1 dose of Tdap. This initial dose should be followed by tetanus and diphtheria toxoids (Td) booster doses every 10 years. Adults with an unknown or incomplete history of completing a 3-dose immunization series with Td-containing vaccines should begin or complete a primary immunization series including a Tdap dose. Adults should receive a Td booster every 10 years.  Varicella vaccine. An adult without evidence of immunity to varicella should receive 2 doses or a second dose if she has previously received 1 dose. Pregnant females who do not have evidence of immunity should receive the first dose after pregnancy. This first dose should be obtained before leaving the health care facility. The second dose should be obtained 4-8 weeks after the first dose.  Human papillomavirus (HPV) vaccine. Females aged 13-26 years who have not received the vaccine previously should obtain the 3-dose series. The vaccine is not recommended for use in pregnant females. However, pregnancy testing is not needed before receiving a dose. If a female is found to be pregnant after receiving a dose, no treatment is needed. In that case, the remaining doses should be delayed until after the pregnancy. Immunization is recommended for any person with an immunocompromised condition through the age of 61 years if she did not get any or all doses earlier. During the  3-dose series, the second dose should be obtained 4-8 weeks after the first dose. The third dose should be obtained 24 weeks after the first dose and 16 weeks after the second dose.  Zoster vaccine. One dose is recommended for adults aged 30 years or older unless certain conditions are present.  Measles, mumps, and rubella (MMR) vaccine. Adults born  before 1957 generally are considered immune to measles and mumps. Adults born in 1957 or later should have 1 or more doses of MMR vaccine unless there is a contraindication to the vaccine or there is laboratory evidence of immunity to each of the three diseases. A routine second dose of MMR vaccine should be obtained at least 28 days after the first dose for students attending postsecondary schools, health care workers, or international travelers. People who received inactivated measles vaccine or an unknown type of measles vaccine during 1963-1967 should receive 2 doses of MMR vaccine. People who received inactivated mumps vaccine or an unknown type of mumps vaccine before 1979 and are at high risk for mumps infection should consider immunization with 2 doses of MMR vaccine. For females of childbearing age, rubella immunity should be determined. If there is no evidence of immunity, females who are not pregnant should be vaccinated. If there is no evidence of immunity, females who are pregnant should delay immunization until after pregnancy. Unvaccinated health care workers born before 1957 who lack laboratory evidence of measles, mumps, or rubella immunity or laboratory confirmation of disease should consider measles and mumps immunization with 2 doses of MMR vaccine or rubella immunization with 1 dose of MMR vaccine.  Pneumococcal 13-valent conjugate (PCV13) vaccine. When indicated, a person who is uncertain of his immunization history and has no record of immunization should receive the PCV13 vaccine. All adults 65 years of age and older should receive this  vaccine. An adult aged 19 years or older who has certain medical conditions and has not been previously immunized should receive 1 dose of PCV13 vaccine. This PCV13 should be followed with a dose of pneumococcal polysaccharide (PPSV23) vaccine. Adults who are at high risk for pneumococcal disease should obtain the PPSV23 vaccine at least 8 weeks after the dose of PCV13 vaccine. Adults older than 51 years of age who have normal immune system function should obtain the PPSV23 vaccine dose at least 1 year after the dose of PCV13 vaccine.  Pneumococcal polysaccharide (PPSV23) vaccine. When PCV13 is also indicated, PCV13 should be obtained first. All adults aged 65 years and older should be immunized. An adult younger than age 65 years who has certain medical conditions should be immunized. Any person who resides in a nursing home or long-term care facility should be immunized. An adult smoker should be immunized. People with an immunocompromised condition and certain other conditions should receive both PCV13 and PPSV23 vaccines. People with human immunodeficiency virus (HIV) infection should be immunized as soon as possible after diagnosis. Immunization during chemotherapy or radiation therapy should be avoided. Routine use of PPSV23 vaccine is not recommended for American Indians, [place] Natives, or people younger than 65 years unless there are medical conditions that require PPSV23 vaccine. When indicated, people who have unknown immunization and have no record of immunization should receive PPSV23 vaccine. One-time revaccination 5 years after the first dose of PPSV23 is recommended for people aged 19-64 years who have chronic kidney failure, nephrotic syndrome, asplenia, or immunocompromised conditions. People who received 1-2 doses of PPSV23 before age 65 years should receive another dose of PPSV23 vaccine at age 65 years or later if at least 5 years have passed since the previous dose. Doses of PPSV23 are not  needed for people immunized with PPSV23 at or after age 65 years.  Meningococcal vaccine. Adults with asplenia or persistent complement component deficiencies should receive 2 doses of quadrivalent meningococcal conjugate (MenACWY-D) vaccine. The doses should be obtained   at least 2 months apart. Microbiologists working with certain meningococcal bacteria, [place] recruits, people at risk during an outbreak, and people who travel to or live in countries with a high rate of meningitis should be immunized. A first-year college student up through age 34 years who is living in a residence hall should receive a dose if she did not receive a dose on or after her 16th birthday. Adults who have certain high-risk conditions should receive one or more doses of vaccine.  Hepatitis A vaccine. Adults who wish to be protected from this disease, have certain high-risk conditions, work with hepatitis A-infected animals, work in hepatitis A research labs, or travel to or work in countries with a high rate of hepatitis A should be immunized. Adults who were previously unvaccinated and who anticipate close contact with an international adoptee during the first 60 days after arrival in the [place] from a country with a high rate of hepatitis A should be immunized.  Hepatitis B vaccine. Adults who wish to be protected from this disease, have certain high-risk conditions, may be exposed to blood or other infectious body fluids, are household contacts or sex partners of hepatitis B positive people, are clients or workers in certain care facilities, or travel to or work in countries with a high rate of hepatitis B should be immunized.  Haemophilus influenzae type b (Hib) vaccine. A previously unvaccinated person with asplenia or sickle cell disease or having a scheduled splenectomy should receive 1 dose of Hib vaccine. Regardless of previous immunization, a recipient of a hematopoietic stem cell transplant should receive a  3-dose series 6-12 months after her successful transplant. Hib vaccine is not recommended for adults with HIV infection. Preventive Services / Frequency Ages 35 to 4 years  Blood pressure check.** / Every 3-5 years.  Lipid and cholesterol check.** / Every 5 years beginning at age 60.  Clinical breast exam.** / Every 3 years for women in their 71s and 10s.  BRCA-related cancer risk assessment.** / For women who have family members with a BRCA-related cancer (breast, ovarian, tubal, or peritoneal cancers).  Pap test.** / Every 2 years from ages 76 through 26. Every 3 years starting at age 61 through age 76 or 93 with a history of 3 consecutive normal Pap tests.  HPV screening.** / Every 3 years from ages 37 through ages 60 to 51 with a history of 3 consecutive normal Pap tests.  Hepatitis C blood test.** / For any individual with known risks for hepatitis C.  Skin self-exam. / Monthly.  Influenza vaccine. / Every year.  Tetanus, diphtheria, and acellular pertussis (Tdap, Td) vaccine.** / Consult your health care provider. Pregnant women should receive 1 dose of Tdap vaccine during each pregnancy. 1 dose of Td every 10 years.  Varicella vaccine.** / Consult your health care provider. Pregnant females who do not have evidence of immunity should receive the first dose after pregnancy.  HPV vaccine. / 3 doses over 6 months, if 93 and younger. The vaccine is not recommended for use in pregnant females. However, pregnancy testing is not needed before receiving a dose.  Measles, mumps, rubella (MMR) vaccine.** / You need at least 1 dose of MMR if you were born in 1957 or later. You may also need a 2nd dose. For females of childbearing age, rubella immunity should be determined. If there is no evidence of immunity, females who are not pregnant should be vaccinated. If there is no evidence of immunity, females who are  pregnant should delay immunization until after pregnancy.  Pneumococcal  13-valent conjugate (PCV13) vaccine.** / Consult your health care provider.  Pneumococcal polysaccharide (PPSV23) vaccine.** / 1 to 2 doses if you smoke cigarettes or if you have certain conditions.  Meningococcal vaccine.** / 1 dose if you are age 68 to 8 years and a Market researcher living in a residence hall, or have one of several medical conditions, you need to get vaccinated against meningococcal disease. You may also need additional booster doses.  Hepatitis A vaccine.** / Consult your health care provider.  Hepatitis B vaccine.** / Consult your health care provider.  Haemophilus influenzae type b (Hib) vaccine.** / Consult your health care provider. Ages 7 to 53 years  Blood pressure check.** / Every year.  Lipid and cholesterol check.** / Every 5 years beginning at age 25 years.  Lung cancer screening. / Every year if you are aged 11-80 years and have a 30-pack-year history of smoking and currently smoke or have quit within the past 15 years. Yearly screening is stopped once you have quit smoking for at least 15 years or develop a health problem that would prevent you from having lung cancer treatment.  Clinical breast exam.** / Every year after age 48 years.  BRCA-related cancer risk assessment.** / For women who have family members with a BRCA-related cancer (breast, ovarian, tubal, or peritoneal cancers).  Mammogram.** / Every year beginning at age 41 years and continuing for as long as you are in good health. Consult with your health care provider.  Pap test.** / Every 3 years starting at age 65 years through age 37 or 70 years with a history of 3 consecutive normal Pap tests.  HPV screening.** / Every 3 years from ages 72 years through ages 60 to 40 years with a history of 3 consecutive normal Pap tests.  Fecal occult blood test (FOBT) of stool. / Every year beginning at age 21 years and continuing until age 5 years. You may not need to do this test if you get  a colonoscopy every 10 years.  Flexible sigmoidoscopy or colonoscopy.** / Every 5 years for a flexible sigmoidoscopy or every 10 years for a colonoscopy beginning at age 35 years and continuing until age 48 years.  Hepatitis C blood test.** / For all people born from [birth year] through 1965 and any individual with known risks for hepatitis C.  Skin self-exam. / Monthly.  Influenza vaccine. / Every year.  Tetanus, diphtheria, and acellular pertussis (Tdap/Td) vaccine.** / Consult your health care provider. Pregnant women should receive 1 dose of Tdap vaccine during each pregnancy. 1 dose of Td every 10 years.  Varicella vaccine.** / Consult your health care provider. Pregnant females who do not have evidence of immunity should receive the first dose after pregnancy.  Zoster vaccine.** / 1 dose for adults aged 30 years or older.  Measles, mumps, rubella (MMR) vaccine.** / You need at least 1 dose of MMR if you were born in 1957 or later. You may also need a second dose. For females of childbearing age, rubella immunity should be determined. If there is no evidence of immunity, females who are not pregnant should be vaccinated. If there is no evidence of immunity, females who are pregnant should delay immunization until after pregnancy.  Pneumococcal 13-valent conjugate (PCV13) vaccine.** / Consult your health care provider.  Pneumococcal polysaccharide (PPSV23) vaccine.** / 1 to 2 doses if you smoke cigarettes or if you have certain conditions.  Meningococcal vaccine.** /  Consult your health care provider.  Hepatitis A vaccine.** / Consult your health care provider.  Hepatitis B vaccine.** / Consult your health care provider.  Haemophilus influenzae type b (Hib) vaccine.** / Consult your health care provider. Ages 64 years and over  Blood pressure check.** / Every year.  Lipid and cholesterol check.** / Every 5 years beginning at age 23 years.  Lung cancer screening. / Every year if you  are aged 16-80 years and have a 30-pack-year history of smoking and currently smoke or have quit within the past 15 years. Yearly screening is stopped once you have quit smoking for at least 15 years or develop a health problem that would prevent you from having lung cancer treatment.  Clinical breast exam.** / Every year after age 74 years.  BRCA-related cancer risk assessment.** / For women who have family members with a BRCA-related cancer (breast, ovarian, tubal, or peritoneal cancers).  Mammogram.** / Every year beginning at age 44 years and continuing for as long as you are in good health. Consult with your health care provider.  Pap test.** / Every 3 years starting at age 58 years through age 22 or 39 years with 3 consecutive normal Pap tests. Testing can be stopped between 65 and 70 years with 3 consecutive normal Pap tests and no abnormal Pap or HPV tests in the past 10 years.  HPV screening.** / Every 3 years from ages 64 years through ages 70 or 61 years with a history of 3 consecutive normal Pap tests. Testing can be stopped between 65 and 70 years with 3 consecutive normal Pap tests and no abnormal Pap or HPV tests in the past 10 years.  Fecal occult blood test (FOBT) of stool. / Every year beginning at age 40 years and continuing until age 27 years. You may not need to do this test if you get a colonoscopy every 10 years.  Flexible sigmoidoscopy or colonoscopy.** / Every 5 years for a flexible sigmoidoscopy or every 10 years for a colonoscopy beginning at age 7 years and continuing until age 32 years.  Hepatitis C blood test.** / For all people born from [birth year] through 1966 and any individual with known risks for hepatitis C.  Osteoporosis screening.** / A one-time screening for women ages 30 years and over and women at risk for fractures or osteoporosis.  Skin self-exam. / Monthly.  Influenza vaccine. / Every year.  Tetanus, diphtheria, and acellular pertussis (Tdap/Td)  vaccine.** / 1 dose of Td every 10 years.  Varicella vaccine.** / Consult your health care provider.  Zoster vaccine.** / 1 dose for adults aged 35 years or older.  Pneumococcal 13-valent conjugate (PCV13) vaccine.** / Consult your health care provider.  Pneumococcal polysaccharide (PPSV23) vaccine.** / 1 dose for all adults aged 46 years and older.  Meningococcal vaccine.** / Consult your health care provider.  Hepatitis A vaccine.** / Consult your health care provider.  Hepatitis B vaccine.** / Consult your health care provider.  Haemophilus influenzae type b (Hib) vaccine.** / Consult your health care provider. ** Family history and personal history of risk and conditions may change your health care provider's recommendations.   This information is not intended to replace advice given to you by your health care provider. Make sure you discuss any questions you have with your health care provider.   Document Released: 08/28/2001 Document Revised: 07/23/2014 Document Reviewed: 11/27/2010 Elsevier Interactive Patient Education [employer].

## 2016-03-02 NOTE — Progress Notes (Signed)
Subjective:     Regina [name] is a 50 y.o. female and is here for a comprehensive physical exam. The patient reports no problems.  Social History   Social History  . Marital status: Married    Spouse name: N/A  . Number of children: N/A  . Years of education: N/A   Occupational History  . 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 Smoking status: Never Smoker  . Smokeless tobacco: Never Used  . Alcohol use Yes     Comment: Rarely  . Drug use: No  . Sexual activity: Not on file   Other Topics Concern  . Not on file   Social History Narrative  . No narrative on file   Health Maintenance  Topic Date Due  . COLONOSCOPY  08/11/2014  . INFLUENZA VACCINE  02/14/2016  . HIV Screening  03/02/2017 (Originally 08/12/1979)  . MAMMOGRAM  07/16/2016  . PAP SMEAR  06/15/2017  . TETANUS/TDAP  01/28/2020    The following portions of the patient's history were reviewed and updated as appropriate: She  has a past medical history of Hypertension and Migraines. She  does not have any pertinent problems on file. She  has a past surgical history that includes Lyphoma. Her family history includes Alcohol abuse in her mother; Diabetes in her mother; Emotional abuse in her mother; Hypertension in her mother. She  reports that she has never smoked. She has never used smokeless tobacco. She reports that she drinks alcohol. She reports that she does not use drugs. She has a current medication list which includes the following prescription(s): amlodipine, amphetamine-dextroamphetamine, amphetamine-dextroamphetamine, biotin, black cohosh, calcium carb-cholecalciferol, estring, hydrochlorothiazide, krill oil, lutein-zeaxanthin, multivitamin, turmeric, vitamin a, and ergocalciferol. Current Outpatient Prescriptions on File Prior to Visit  Medication Sig Dispense Refill  . amLODipine (NORVASC) 2.5 MG tablet Take 1 tablet by mouth  daily 90 tablet 0  . Calcium Carb-Cholecalciferol  (CALCIUM 1000 + D PO) Take by mouth.    Courtney Paris. ESTRING 2 MG vaginal ring Place 1 Device vaginally every 3 (three) months.  3  . KRILL OIL 1000 MG CAPS Take 1 capsule by mouth daily.    . Lutein-Zeaxanthin 15-0.7 MG CAPS Take by mouth.    . Multiple Vitamin (MULTIVITAMIN) tablet Take 1 tablet by mouth daily.     No current facility-administered medications on file prior to visit.    She is allergic to lisinopril..  Review of Systems Review of Systems  Constitutional: Negative for activity change, appetite change and fatigue.  HENT: Negative for hearing loss, congestion, tinnitus and ear discharge.  dentist q2041m Eyes: Negative for visual disturbance (see optho q1y -- vision corrected to 20/20 with glasses).  Respiratory: Negative for cough, chest tightness and shortness of breath.   Cardiovascular: Negative for chest pain, palpitations and leg swelling.  Gastrointestinal: Negative for abdominal pain, diarrhea, constipation and abdominal distention.  Genitourinary: Negative for urgency, frequency, decreased urine volume and difficulty urinating.  Musculoskeletal: Negative for back pain, arthralgias and gait problem.  Skin: Negative for color change, pallor and rash.  Neurological: Negative for dizziness, light-headedness, numbness and headaches.  Hematological: Negative for adenopathy. Does not bruise/bleed easily.  Psychiatric/Behavioral: Negative for suicidal ideas, confusion, sleep disturbance, self-injury, dysphoric mood, decreased concentration and agitation.       Objective:    BP 134/80 (BP Location: Left Arm, Patient Position: Sitting, Cuff Size: Normal)   Pulse 92   Temp 98.1 F (36.7 C) (Oral)   Ht  5\' 6"  (1.676 m)   Wt 149 lb 12.8 oz (67.9 kg)   SpO2 99%   BMI 24.18 kg/m  General appearance: alert, cooperative, appears stated age and no distress Head: Normocephalic, without obvious abnormality, atraumatic Eyes: conjunctivae/corneas clear. PERRL, EOM's intact. Fundi  benign. Ears: normal TM's and external ear canals both ears Nose: Nares normal. Septum midline. Mucosa normal. No drainage or sinus tenderness. Throat: lips, mucosa, and tongue normal; teeth and gums normal Neck: no adenopathy, no carotid bruit, no JVD, supple, symmetrical, trachea midline and thyroid not enlarged, symmetric, no tenderness/mass/nodules Back: symmetric, no curvature. ROM normal. No CVA tenderness. Lungs: clear to auscultation bilaterally Breasts: normal appearance, no masses or tenderness Heart: regular rate and rhythm, S1, S2 normal, no murmur, click, rub or gallop Abdomen: soft, non-tender; bowel sounds normal; no masses,  no organomegaly Pelvic: deferred Extremities: extremities normal, atraumatic, no cyanosis or edema Pulses: 2+ and symmetric Skin: Skin color, texture, turgor normal. No rashes or lesions Lymph nodes: Cervical, supraclavicular, and axillary nodes normal. Neurologic: Alert and oriented X 3, normal strength and tone. Normal symmetric reflexes. Normal coordination and gait    Assessment:    Healthy female exam.      Plan:    ghm utd Check labs See After Visit Summary for Counseling Recommendations    1. Preventative health care As above - Ambulatory referral to Gastroenterology - Comprehensive metabolic panel - Lipid panel - CBC with Differential/Platelet - POCT urinalysis dipstick - TSH  2. ADD (attention deficit disorder)  - amphetamine-dextroamphetamine (ADDERALL) 20 MG tablet; Take 1 tablet by mouth in the afternoon  Dispense: 90 tablet; Refill: 0 - amphetamine-dextroamphetamine (ADDERALL XR) 30 MG 24 hr capsule; Take 1 capsule (30 mg total) by mouth every morning.  Dispense: 90 capsule; Refill: 0  3. Essential hypertension stable - hydrochlorothiazide (HYDRODIURIL) 25 MG tablet; Take 0.5 tablets (12.5 mg total) by mouth daily.  Dispense: 90 tablet; Refill: 3 - Comprehensive metabolic panel - Lipid panel - CBC with  Differential/Platelet - POCT urinalysis dipstick - TSH

## 2016-03-29 ENCOUNTER — Encounter: Payer: Self-pay | Admitting: Internal Medicine

## 2016-03-29 ENCOUNTER — Ambulatory Visit (AMBULATORY_SURGERY_CENTER): Payer: Self-pay | Admitting: *Deleted

## 2016-03-29 VITALS — Ht 65.5 in | Wt 150.0 lb

## 2016-03-29 DIAGNOSIS — Z1211 Encounter for screening for malignant neoplasm of colon: Secondary | ICD-10-CM

## 2016-03-29 NOTE — Progress Notes (Signed)
No egg or soy allergy known to patient  No issues with past sedation with any surgeries  or procedures, no intubation problems  No diet pills per patient No home 02 use per patient  No blood thinners per patient  Pt denies issues with constipation  No A fib or A flutter   emmi denied

## 2016-04-04 ENCOUNTER — Other Ambulatory Visit: Payer: Self-pay | Admitting: Family Medicine

## 2016-04-04 DIAGNOSIS — F988 Other specified behavioral and emotional disorders with onset usually occurring in childhood and adolescence: Secondary | ICD-10-CM

## 2016-04-04 MED ORDER — AMPHETAMINE-DEXTROAMPHET ER 30 MG PO CP24: 30.0000 mg | ORAL_CAPSULE | ORAL | 0 refills | Status: DC

## 2016-04-04 NOTE — Telephone Encounter (Signed)
Rx was given on 03/02/16 #90   Please advise     KP

## 2016-04-05 MED ORDER — AMPHETAMINE-DEXTROAMPHET ER 30 MG PO CP24: 30.0000 mg | ORAL_CAPSULE | ORAL | 0 refills | Status: DC

## 2016-04-05 NOTE — Addendum Note (Signed)
Addended by: Arnette NorrisPAYNE, Makinlee Awwad P on: 04/05/2016 12:13 PM   Modules accepted: Orders

## 2016-04-05 NOTE — Telephone Encounter (Signed)
Ok to give 3 scripts 

## 2016-04-05 NOTE — Telephone Encounter (Signed)
She is requesting 3 scripts. Please advise    KP

## 2016-04-05 NOTE — Telephone Encounter (Signed)
Patient aware Rx printed.    KP

## 2016-04-12 ENCOUNTER — Encounter: Payer: Self-pay | Admitting: Internal Medicine

## 2016-04-12 ENCOUNTER — Ambulatory Visit (AMBULATORY_SURGERY_CENTER): Payer: 59 | Admitting: Internal Medicine

## 2016-04-12 VITALS — BP 117/78 | HR 61 | Temp 98.2°F | Resp 14 | Ht 65.0 in | Wt 150.0 lb

## 2016-04-12 DIAGNOSIS — Z1211 Encounter for screening for malignant neoplasm of colon: Secondary | ICD-10-CM

## 2016-04-12 MED ORDER — SODIUM CHLORIDE 0.9 % IV SOLN: 500.0000 mL | INTRAVENOUS | Status: AC

## 2016-04-12 NOTE — Op Note (Signed)
[facility] Patient Name: Regina SellarStephanie Ferrell Procedure Date: 04/12/2016 1:10 PM MRN: [id number] Endoscopist: Iva Booparl E Gessner , MD Age: [age] Referring MD:  Date of Birth: [birth year] Gender: Female Account #: [id number] Procedure:                Colonoscopy Indications:              Screening for colorectal malignant neoplasm Medicines:                Propofol per Anesthesia, Monitored Anesthesia Care Procedure:                Pre-Anesthesia Assessment:                           - Prior to the procedure, a History and Physical                            was performed, and patient medications and                            allergies were reviewed. The patient's tolerance of                            previous anesthesia was also reviewed. The risks                            and benefits of the procedure and the sedation                            options and risks were discussed with the patient.                            All questions were answered, and informed consent                            was obtained. Prior Anticoagulants: The patient has                            taken no previous anticoagulant or antiplatelet                            agents. ASA Grade Assessment: II - A patient with                            mild systemic disease. After reviewing the risks                            and benefits, the patient was deemed in                            satisfactory condition to undergo the procedure.                           After obtaining informed consent, the colonoscope  was passed under direct vision. Throughout the                            procedure, the patient's blood pressure, pulse, and                            oxygen saturations were monitored continuously. The                            Model CF-HQ190L [id number]) scope was introduced                            through the anus and advanced to the the cecum,             identified by appendiceal orifice and ileocecal                            valve. The ileocecal valve, appendiceal orifice,                            and rectum were photographed. The quality of the                            bowel preparation was excellent. The colonoscopy                            was performed without difficulty. The patient                            tolerated the procedure well. The bowel preparation                            used was Miralax. Scope In: 1:17:03 PM Scope Out: 1:29:32 PM Scope Withdrawal Time: 0 hours 9 minutes 26 seconds  Total Procedure Duration: 0 hours 12 minutes 29 seconds  Findings:                 The perianal and digital rectal examinations were                            normal.                           The colon (entire examined portion) appeared normal.                           No additional abnormalities were found on                            retroflexion. Complications:            No immediate complications. Estimated blood loss:                            None. Estimated Blood Loss:     Estimated blood loss: none. Recommendation:           -  Repeat colonoscopy in 10 years for screening                            purposes.                           - Patient has a contact number available for                            emergencies. The signs and symptoms of potential                            delayed complications were discussed with the                            patient. Return to normal activities tomorrow.                            Written discharge instructions were provided to the                            patient.                           - Resume previous diet.                           - Continue present medications.                           - Patient has a contact number available for                            emergencies. The signs and symptoms of potential                            delayed  complications were discussed with the                            patient. Return to normal activities tomorrow.                            Written discharge instructions were provided to the                            patient.                           - Patient has a contact number available for                            emergencies. The signs and symptoms of potential                            delayed complications were discussed with the  patient. Return to normal activities tomorrow.                            Written discharge instructions were provided to the                            patient. Iva Boop, MD 04/12/2016 1:35:54 PM This report has been signed electronically.

## 2016-04-12 NOTE — Patient Instructions (Addendum)
   The colonoscopy was normal.  Next routine colonoscopy/screening test in 10 years - 2027  I appreciate the opportunity to care for you. Pristine Gladhill E. Yarieliz Wasser, MD, FACG   YOU HAD AN ENDOSCOPIC PROCEDURE TODAY AT THE [facility]:   Refer to the procedure report that was given to you for any specific questions about what was found during the examination.  If the procedure report does not answer your questions, please call your gastroenterologist to clarify.  If you requested that your care partner not be given the details of your procedure findings, then the procedure report has been included in a sealed envelope for you to review at your convenience later.  YOU SHOULD EXPECT: Some feelings of bloating in the abdomen. Passage of more gas than usual.  Walking can help get rid of the air that was put into your GI tract during the procedure and reduce the bloating. If you had a lower endoscopy (such as a colonoscopy or flexible sigmoidoscopy) you may notice spotting of blood in your stool or on the toilet paper. If you underwent a bowel prep for your procedure, you may not have a normal bowel movement for a few days.  Please Note:  You might notice some irritation and congestion in your nose or some drainage.  This is from the oxygen used during your procedure.  There is no need for concern and it should clear up in a day or so.  SYMPTOMS TO REPORT IMMEDIATELY:   Following lower endoscopy (colonoscopy or flexible sigmoidoscopy):  Excessive amounts of blood in the stool  Significant tenderness or worsening of abdominal pains  Swelling of the abdomen that is new, acute  Fever of 100F or higher    For urgent or emergent issues, a gastroenterologist can be reached at any hour by calling [phone].   DIET:  We do recommend a small meal at first, but then you may proceed to your regular diet.  Drink plenty of fluids but you should avoid alcoholic beverages for 24  hours.  ACTIVITY:  You should plan to take it easy for the rest of today and you should NOT DRIVE or use heavy machinery until tomorrow (because of the sedation medicines used during the test).    FOLLOW UP: Our staff will call the number listed on your records the next business day following your procedure to check on you and address any questions or concerns that you may have regarding the information given to you following your procedure. If we do not reach you, we will leave a message.  However, if you are feeling well and you are not experiencing any problems, there is no need to return our call.  We will assume that you have returned to your regular daily activities without incident.  If any biopsies were taken you will be contacted by phone or by letter within the next 1-3 weeks.  Please call us at [phone] if you have not heard about the biopsies in 3 weeks.    SIGNATURES/CONFIDENTIALITY: You and/or your care partner have signed paperwork which will be entered into your electronic medical record.  These signatures attest to the fact that that the information above on your After Visit Summary has been reviewed and is understood.  Full responsibility of the confidentiality of this discharge information lies with you and/or your care-partner. 

## 2016-04-12 NOTE — Progress Notes (Signed)
A and O x3. Report to RN. Tolerated MAC anesthesia well. 

## 2016-04-13 ENCOUNTER — Telehealth: Payer: Self-pay | Admitting: *Deleted

## 2016-04-13 NOTE — Telephone Encounter (Signed)
Message left on f/u call °

## 2016-04-14 ENCOUNTER — Other Ambulatory Visit: Payer: Self-pay | Admitting: Family Medicine

## 2016-04-16 ENCOUNTER — Telehealth: Payer: Self-pay

## 2016-04-16 NOTE — Telephone Encounter (Signed)
  Follow up Call-  Call back number 04/12/2016  Post procedure Call Back phone  # [phone]  Permission to leave phone message Yes  Some recent data might be hidden     Patient questions:  Do you have a fever, pain , or abdominal swelling? No. Pain Score  0 *  Have you tolerated food without any problems? Yes.    Have you been able to return to your normal activities? Yes.    Do you have any questions about your discharge instructions: Diet   No. Medications  No. Follow up visit  No.  Do you have questions or concerns about your Care? No.  Actions: * If pain score is 4 or above: No action needed, pain <4.

## 2016-05-14 ENCOUNTER — Encounter: Payer: Self-pay | Admitting: Family Medicine

## 2016-05-15 NOTE — Telephone Encounter (Signed)
I need someone to check the registry for me if I can't get access by Thursday

## 2016-05-17 NOTE — Telephone Encounter (Signed)
I will check registry. And let you know what I find. I only found one page. I will print and place it on your desk.

## 2016-05-17 NOTE — Telephone Encounter (Signed)
Regina [name], will you check registry for Dr. Laury AxonLowne until we obtain access for her.

## 2016-05-18 MED ORDER — AMPHETAMINE-DEXTROAMPHET ER 30 MG PO CP24: 30.0000 mg | ORAL_CAPSULE | ORAL | 0 refills | Status: DC

## 2016-05-18 NOTE — Telephone Encounter (Signed)
Thanks. Print out forwarded to Dr. Laury AxonLowne.  Dr. Laury AxonLowne has agreed to reorder Adderall x 2.  Rx's printed and forwarded to Dr. Laury AxonLowne for review and signature.

## 2016-06-22 ENCOUNTER — Encounter: Payer: Self-pay | Admitting: Family Medicine

## 2016-06-22 NOTE — Telephone Encounter (Signed)
yes

## 2016-06-25 ENCOUNTER — Encounter: Payer: Self-pay | Admitting: Family Medicine

## 2016-06-26 ENCOUNTER — Telehealth: Payer: Self-pay | Admitting: Family Medicine

## 2016-06-26 ENCOUNTER — Other Ambulatory Visit: Payer: Self-pay

## 2016-06-26 DIAGNOSIS — Z008 Encounter for other general examination: Secondary | ICD-10-CM

## 2016-06-26 DIAGNOSIS — F988 Other specified behavioral and emotional disorders with onset usually occurring in childhood and adolescence: Secondary | ICD-10-CM

## 2016-06-26 DIAGNOSIS — Z0189 Encounter for other specified special examinations: Principal | ICD-10-CM

## 2016-06-26 MED ORDER — AMPHETAMINE-DEXTROAMPHET ER 30 MG PO CP24: 30.0000 mg | ORAL_CAPSULE | ORAL | 0 refills | Status: DC

## 2016-06-26 MED ORDER — HYDROCHLOROTHIAZIDE 12.5 MG PO TABS: 12.5000 mg | ORAL_TABLET | Freq: Every day | ORAL | 3 refills | Status: DC

## 2016-06-26 NOTE — Telephone Encounter (Signed)
Last seen 03/02/16 Last filled 05/18/16 2 prescriptions #30- 0 [id number]  Please advise PC

## 2016-06-26 NOTE — Telephone Encounter (Signed)
Pt dropped off a wellness form for Dr. Laury AxonLowne to fill out, pt would like the form faxed however would also like a copy documents placed in tray at front office

## 2016-06-27 ENCOUNTER — Encounter: Payer: Self-pay | Admitting: Family Medicine

## 2016-06-29 ENCOUNTER — Other Ambulatory Visit: Payer: Self-pay

## 2016-06-29 DIAGNOSIS — F988 Other specified behavioral and emotional disorders with onset usually occurring in childhood and adolescence: Secondary | ICD-10-CM

## 2016-06-29 MED ORDER — AMPHETAMINE-DEXTROAMPHETAMINE 20 MG PO TABS
ORAL_TABLET | ORAL | 0 refills | Status: DC
Start: 2016-06-29 — End: 2016-08-06

## 2016-06-29 NOTE — Telephone Encounter (Signed)
Wrong medication was prescribed. Per Dr. Laury AxonLowne we have the old rx for the wrong medication dose.  Allowed to print off the correct one for patient to pick up. PC

## 2016-07-02 ENCOUNTER — Other Ambulatory Visit: Payer: Self-pay | Admitting: Family Medicine

## 2016-07-02 NOTE — Telephone Encounter (Signed)
Please Advise of status on [employer] paperwork and place Urine drug screen for Tobacco for patient and call him when ready/SLS 12/18

## 2016-07-03 ENCOUNTER — Encounter: Payer: Self-pay | Admitting: Family Medicine

## 2016-07-05 ENCOUNTER — Other Ambulatory Visit: Payer: 59

## 2016-07-05 ENCOUNTER — Other Ambulatory Visit: Payer: Self-pay | Admitting: Family Medicine

## 2016-07-05 DIAGNOSIS — Z0189 Encounter for other specified special examinations: Principal | ICD-10-CM

## 2016-07-05 DIAGNOSIS — Z008 Encounter for other general examination: Secondary | ICD-10-CM

## 2016-07-05 NOTE — Addendum Note (Signed)
Addended by: Harley AltoPRICE, Luian Schumpert M on: 07/05/2016 10:00 AM   Modules accepted: Orders

## 2016-07-11 LAB — NICOTINE AND COTININE LC/MS/MS, U

## 2016-07-12 ENCOUNTER — Encounter: Payer: Self-pay | Admitting: Family Medicine

## 2016-07-12 NOTE — Telephone Encounter (Signed)
Relation to WU:JWJXpt:self Call back number: [phone]   Reason for call:  Patient checking on the status of my chart message

## 2016-07-17 NOTE — Telephone Encounter (Signed)
Pt came in office and stated when done with document if possible to mail documents to her address.

## 2016-07-19 NOTE — Telephone Encounter (Signed)
Spoke with pt to inform her that Physician Wellness Screening Results Form is complete, sent by mail, per pt's request. Pt had no further questions or concerns. LB

## 2016-07-27 ENCOUNTER — Other Ambulatory Visit: Payer: Self-pay | Admitting: Family Medicine

## 2016-07-27 DIAGNOSIS — F988 Other specified behavioral and emotional disorders with onset usually occurring in childhood and adolescence: Secondary | ICD-10-CM

## 2016-08-06 ENCOUNTER — Encounter: Payer: Self-pay | Admitting: Family Medicine

## 2016-08-06 ENCOUNTER — Other Ambulatory Visit: Payer: Self-pay | Admitting: Family Medicine

## 2016-08-06 MED ORDER — AMPHETAMINE-DEXTROAMPHET ER 20 MG PO CP24: 20.0000 mg | ORAL_CAPSULE | Freq: Every day | ORAL | 0 refills | Status: DC

## 2016-08-06 NOTE — Telephone Encounter (Signed)
Ok to dec adderall dose

## 2016-08-06 NOTE — Telephone Encounter (Signed)
Printed January, February and March 2018 hardcopy of Adderall 20 mg XR. Patient notified to pickup hardcopy's at the front desk.

## 2016-08-24 DIAGNOSIS — G5601 Carpal tunnel syndrome, right upper limb: Secondary | ICD-10-CM | POA: Diagnosis not present

## 2016-08-24 DIAGNOSIS — M542 Cervicalgia: Secondary | ICD-10-CM | POA: Diagnosis not present

## 2016-08-24 DIAGNOSIS — M545 Low back pain: Secondary | ICD-10-CM | POA: Diagnosis not present

## 2016-08-30 DIAGNOSIS — M542 Cervicalgia: Secondary | ICD-10-CM | POA: Diagnosis not present

## 2016-08-30 DIAGNOSIS — G5601 Carpal tunnel syndrome, right upper limb: Secondary | ICD-10-CM | POA: Diagnosis not present

## 2016-08-30 DIAGNOSIS — M545 Low back pain: Secondary | ICD-10-CM | POA: Diagnosis not present

## 2016-09-03 DIAGNOSIS — G5601 Carpal tunnel syndrome, right upper limb: Secondary | ICD-10-CM | POA: Diagnosis not present

## 2016-09-03 DIAGNOSIS — M545 Low back pain: Secondary | ICD-10-CM | POA: Diagnosis not present

## 2016-09-03 DIAGNOSIS — M542 Cervicalgia: Secondary | ICD-10-CM | POA: Diagnosis not present

## 2016-09-06 ENCOUNTER — Ambulatory Visit: Payer: 59 | Admitting: Family Medicine

## 2016-09-12 DIAGNOSIS — M545 Low back pain: Secondary | ICD-10-CM | POA: Diagnosis not present

## 2016-09-12 DIAGNOSIS — M542 Cervicalgia: Secondary | ICD-10-CM | POA: Diagnosis not present

## 2016-09-12 DIAGNOSIS — G5601 Carpal tunnel syndrome, right upper limb: Secondary | ICD-10-CM | POA: Diagnosis not present

## 2016-09-17 DIAGNOSIS — M542 Cervicalgia: Secondary | ICD-10-CM | POA: Diagnosis not present

## 2016-09-17 DIAGNOSIS — M545 Low back pain: Secondary | ICD-10-CM | POA: Diagnosis not present

## 2016-09-17 DIAGNOSIS — G5601 Carpal tunnel syndrome, right upper limb: Secondary | ICD-10-CM | POA: Diagnosis not present

## 2016-10-03 DIAGNOSIS — M542 Cervicalgia: Secondary | ICD-10-CM | POA: Diagnosis not present

## 2016-10-03 DIAGNOSIS — M545 Low back pain: Secondary | ICD-10-CM | POA: Diagnosis not present

## 2016-10-03 DIAGNOSIS — G5601 Carpal tunnel syndrome, right upper limb: Secondary | ICD-10-CM | POA: Diagnosis not present

## 2016-10-23 ENCOUNTER — Encounter: Payer: Self-pay | Admitting: Family Medicine

## 2016-10-23 ENCOUNTER — Other Ambulatory Visit: Payer: Self-pay | Admitting: Family Medicine

## 2016-10-23 MED ORDER — AMPHETAMINE-DEXTROAMPHETAMINE 15 MG PO TABS: 15.0000 mg | ORAL_TABLET | Freq: Every day | ORAL | 0 refills | Status: DC

## 2016-10-23 NOTE — Telephone Encounter (Signed)
Ok to change to adderall 15 mg #90   She will need to bring old rx in

## 2016-10-23 NOTE — Telephone Encounter (Signed)
Updated medication list/printed new adderall per PCP instructions. Patient needs to return previous adderalls before getting these new ones. Prescription is on my desk.

## 2016-10-26 NOTE — Telephone Encounter (Signed)
Have put new prescriptions in cabnet at the front desk Patient needs to first return old prescriptions before getting new one.  New prescriptions are in envelope in cabnet

## 2016-10-29 ENCOUNTER — Telehealth: Payer: Self-pay | Admitting: Family Medicine

## 2016-10-29 NOTE — Telephone Encounter (Signed)
Pt dropped off old prescription for Adderall and picked up the new prescription for Adderall. (old prescription put in the shred bin)

## 2016-12-03 ENCOUNTER — Encounter: Payer: Self-pay | Admitting: Family Medicine

## 2016-12-03 ENCOUNTER — Ambulatory Visit (INDEPENDENT_AMBULATORY_CARE_PROVIDER_SITE_OTHER): Payer: 59 | Admitting: Family Medicine

## 2016-12-03 VITALS — BP 128/82 | HR 75 | Temp 98.2°F | Ht 66.0 in | Wt 152.6 lb

## 2016-12-03 DIAGNOSIS — Z780 Asymptomatic menopausal state: Secondary | ICD-10-CM | POA: Diagnosis not present

## 2016-12-03 DIAGNOSIS — F909 Attention-deficit hyperactivity disorder, unspecified type: Secondary | ICD-10-CM

## 2016-12-03 MED ORDER — ESTRADIOL 10 MCG VA TABS: ORAL_TABLET | VAGINAL | 11 refills | Status: DC

## 2016-12-03 MED ORDER — AMPHETAMINE-DEXTROAMPHETAMINE 15 MG PO TABS: 15.0000 mg | ORAL_TABLET | Freq: Two times a day (BID) | ORAL | 0 refills | Status: DC

## 2016-12-03 MED ORDER — AMPHETAMINE-DEXTROAMPHETAMINE 15 MG PO TABS: 15.0000 mg | ORAL_TABLET | Freq: Every day | ORAL | 0 refills | Status: DC

## 2016-12-03 NOTE — Patient Instructions (Signed)
Estradiol vaginal tablets What is this medicine? ESTRADIOL (es tra DYE ole) vaginal tablet is used to help relieve symptoms of vaginal irritation and dryness that occurs in some women during menopause. This medicine may be used for other purposes; ask your health care provider or pharmacist if you have questions. COMMON BRAND NAME(S): Vagifem, Yuvafem What should I tell my health care provider before I take this medicine? They need to know if you have any of these conditions: -abnormal vaginal bleeding -blood vessel disease or blood clots -breast, cervical, endometrial, ovarian, liver, or uterine cancer -dementia -diabetes -gallbladder disease -heart disease or recent heart attack -high blood pressure -high cholesterol -high level of calcium in the blood -hysterectomy -kidney disease -liver disease -migraine headaches -protein C deficiency -protein S deficiency -stroke -systemic lupus erythematosus (SLE) -tobacco smoker -an unusual or allergic reaction to estrogens, other hormones, medicines, foods, dyes, or preservatives -pregnant or trying to get pregnant -breast-feeding How should I use this medicine? This medicine is only for use in the vagina. Do not take by mouth. Wash and dry your hands before and after use. Read package directions carefully. Unwrap the applicator package. Be sure to use a new applicator for each dose. Use at the same time each day. If the tablet has fallen out of the applicator, but is still in the package, carefully place it back into the applicator. If the tablet has fallen out of the package, that applicator should be thrown out and you should use a new applicator containing a new tablet. Lie on your back, part and bend your knees. Gently insert the applicator as far as comfortably possible into the vagina. Then, gently press the plunger until the plunger is fully depressed. This will release the tablet into the vagina. Gently remove the applicator. Throw  away the applicator after use. Do not use your medicine more often than directed. Do not stop using except on the advice of your doctor or health care professional. Talk to your pediatrician regarding the use of this medicine in children. This medicine is not approved for use in children. A patient package insert for the product will be given with each prescription and refill. Read this sheet carefully each time. The sheet may change frequently. Overdosage: If you think you have taken too much of this medicine contact a poison control center or emergency room at once. NOTE: This medicine is only for you. Do not share this medicine with others. What if I miss a dose? If you miss a dose, take it as soon as you can. If it is almost time for your next dose, take only that dose. Do not take double or extra doses. What may interact with this medicine? Do not take this medicine with any of the following medications: -aromatase inhibitors like aminoglutethimide, anastrozole, exemestane, letrozole, testolactone This medicine may also interact with the following medications: -antibiotics used to treat tuberculosis like rifabutin, rifampin and rifapentene -raloxifene or tamoxifen -warfarin This list may not describe all possible interactions. Give your health care provider a list of all the medicines, herbs, non-prescription drugs, or dietary supplements you use. Also tell them if you smoke, drink alcohol, or use illegal drugs. Some items may interact with your medicine. What should I watch for while using this medicine? Visit your health care professional for regular checks on your progress. You will need a regular breast and pelvic exam. You should also discuss the need for regular mammograms with your health care professional, and follow his or her   guidelines. This medicine can make your body retain fluid, making your fingers, hands, or ankles swell. Your blood pressure can go up. Contact your doctor or  health care professional if you feel you are retaining fluid. If you have any reason to think you are pregnant; stop taking this medicine at once and contact your doctor or health care professional. Tobacco smoking increases the risk of getting a blood clot or having a stroke, especially if you are more than 52 years old. You are strongly advised not to smoke. If you wear contact lenses and notice visual changes, or if the lenses begin to feel uncomfortable, consult your eye care specialist. If you are going to have elective surgery, you may need to stop taking this medicine beforehand. Consult your health care professional for advice prior to scheduling the surgery. What side effects may I notice from receiving this medicine? Side effects that you should report to your doctor or health care professional as soon as possible: -allergic reactions like skin rash, itching or hives, swelling of the face, lips, or tongue -breast tissue changes or discharge -changes in vision -chest pain -confusion, trouble speaking or understanding -dark urine -general ill feeling or flu-like symptoms -light-colored stools -nausea, vomiting -pain, swelling, warmth in the leg -right upper belly pain -severe headaches -shortness of breath -sudden numbness or weakness of the face, arm or leg -trouble walking, dizziness, loss of balance or coordination -unusual vaginal bleeding -yellowing of the eyes or skin Side effects that usually do not require medical attention (report to your doctor or health care professional if they continue or are bothersome): -hair loss -increased hunger or thirst -increased urination -symptoms of vaginal infection like itching, irritation or unusual discharge -unusually weak or tired This list may not describe all possible side effects. Call your doctor for medical advice about side effects. You may report side effects to FDA at 1-800-FDA-1088. Where should I keep my medicine? Keep  out of the reach of children. Store at room temperature between 15 and 30 degrees C (59 and 86 degrees F). Throw away any unused medicine after the expiration date. NOTE: This sheet is a summary. It may not cover all possible information. If you have questions about this medicine, talk to your doctor, pharmacist, or health care provider.  2018 Elsevier/Gold Standard (2014-06-16 09:22:51)  

## 2016-12-03 NOTE — Progress Notes (Signed)
Patient ID: Regina [name], female    DOB: 1966-04-15  Age: 51 y.o. MRN: [id number]    Subjective:  Subjective  HPI Regina [name] presents for f/u ADD.  No complaints.  She is doing well with the medication.   Review of Systems  Constitutional: Negative for appetite change, diaphoresis, fatigue and unexpected weight change.  Eyes: Negative for pain, redness and visual disturbance.  Respiratory: Negative for cough, chest tightness, shortness of breath and wheezing.   Cardiovascular: Negative for chest pain, palpitations and leg swelling.  Endocrine: Negative for cold intolerance, heat intolerance, polydipsia, polyphagia and polyuria.  Genitourinary: Negative for difficulty urinating, dysuria and frequency.  Neurological: Negative for dizziness, light-headedness, numbness and headaches.    History Past Medical History:  Diagnosis Date  . Allergy   . Anxiety   . Hypertension   . Migraines    Hormonal  . Osteoarthritis of neck   . Scoliosis     She has a past surgical history that includes Lyphoma.   Her family history includes Alcohol abuse in her mother; Diabetes in her mother; Emotional abuse in her mother; Hypertension in her mother.She reports that she has quit smoking. She has never used smokeless tobacco. She reports that she does not drink alcohol or use drugs.  Current Outpatient Prescriptions on File Prior to Visit  Medication Sig Dispense Refill  . amLODipine (NORVASC) 2.5 MG tablet TAKE 1 TABLET BY MOUTH  DAILY 90 tablet 1  . Biotin 2500 MCG CAPS Take by mouth.    Marland Kitchen. BLACK COHOSH PO Take 540 mg by mouth daily.    . Calcium Carb-Cholecalciferol (CALCIUM 1000 + D PO) Take by mouth.    . hydrochlorothiazide (HYDRODIURIL) 12.5 MG tablet Take 1 tablet (12.5 mg total) by mouth daily. 90 tablet 3  . KRILL OIL 1000 MG CAPS Take 1 capsule by mouth daily.    . Lutein-Zeaxanthin 15-0.7 MG CAPS Take by mouth.    . Magnesium 200 MG TABS Take 1 tablet by mouth daily.      . Multiple Vitamin (MULTIVITAMIN) tablet Take 1 tablet by mouth daily.    . Turmeric 500 MG CAPS Take by mouth.    . vitamin A [id number] UNIT capsule Take 25,000 Units by mouth daily.    Marland Kitchen. VITAMIN D, ERGOCALCIFEROL, PO Take 2,000 mg by mouth daily.      Current Facility-Administered Medications on File Prior to Visit  Medication Dose Route Frequency Provider Last Rate Last Dose  . 0.9 %  sodium chloride infusion  500 mL Intravenous Continuous Iva BoopGessner, Carl E, MD         Objective:  Objective  Physical Exam  Constitutional: She is oriented to person, place, and time. She appears well-developed and well-nourished.  HENT:  Head: Normocephalic and atraumatic.  Eyes: Conjunctivae and EOM are normal.  Neck: Normal range of motion. Neck supple. No JVD present. Carotid bruit is not present. No thyromegaly present.  Cardiovascular: Normal rate, regular rhythm and normal heart sounds.   No murmur heard. Pulmonary/Chest: Effort normal and breath sounds normal. No respiratory distress. She has no wheezes. She has no rales. She exhibits no tenderness.  Musculoskeletal: She exhibits no edema.  Neurological: She is alert and oriented to person, place, and time.  Psychiatric: She has a normal mood and affect.  Nursing note and vitals reviewed.  BP 128/82 (BP Location: Left Arm, Patient Position: Sitting, Cuff Size: Normal)   Pulse 75   Temp 98.2 F (36.8 C) (Oral)  Ht 5\' 6"  (1.676 m)   Wt 152 lb 9.6 oz (69.2 kg)   LMP 04/29/2010   SpO2 98%   BMI 24.63 kg/m  Wt Readings from Last 3 Encounters:  12/03/16 152 lb 9.6 oz (69.2 kg)  04/12/16 150 lb (68 kg)  03/29/16 150 lb (68 kg)     Lab Results  Component Value Date   WBC 5.4 03/02/2016   HGB 13.6 03/02/2016   HCT 40.4 03/02/2016   PLT 249.0 03/02/2016   GLUCOSE 95 03/02/2016   CHOL 180 03/02/2016   TRIG 91.0 03/02/2016   HDL 58.00 03/02/2016   LDLCALC 104 (H) 03/02/2016   ALT 19 03/02/2016   AST 20 03/02/2016   NA 143 03/02/2016    K 3.6 03/02/2016   CL 105 03/02/2016   CREATININE 0.97 03/02/2016   BUN 13 03/02/2016   CO2 33 (H) 03/02/2016   TSH 1.49 03/02/2016    Ct Head Wo Contrast  Result Date: 05/20/2007 Clinical Data: 51 year-old with headache and high blood pressure. HEAD CT WITHOUT CONTRAST: Technique: Contiguous axial images were obtained from the base of the skull through the vertex according to standard protocol without contrast. Comparison: None. Findings: There is no evidence of intracranial hemorrhage, brain edema, acute infarct, mass lesion, or mass effect.  No other intra-axial abnormalities are seen, and the ventricles are within normal limits.  No abnormal extra-axial fluid collections or masses are identified.  No skull abnormalities are noted. IMPRESSION: Negative non-contrast head CT.     Provider: Amaryllis Dyke    Assessment & Plan:  Plan  I have discontinued [name]'s ESTRING, ESTRING, amphetamine-dextroamphetamine, and amphetamine-dextroamphetamine. I have also changed her amphetamine-dextroamphetamine. Additionally, I am having her start on amphetamine-dextroamphetamine, amphetamine-dextroamphetamine, and Estradiol. Lastly, I am having her maintain her Krill Oil, Calcium Carb-Cholecalciferol (CALCIUM 1000 + D PO), multivitamin, Lutein-Zeaxanthin, (VITAMIN D, ERGOCALCIFEROL, PO), BLACK COHOSH PO, Turmeric, Biotin, vitamin A, Magnesium, amLODipine, and hydrochlorothiazide. We will continue to administer sodium chloride.  Meds ordered this encounter  Medications  . amphetamine-dextroamphetamine (ADDERALL) 15 MG tablet    Sig: Take 1 tablet by mouth 2 (two) times daily. June 2018    Dispense:  60 tablet    Refill:  0  . amphetamine-dextroamphetamine (ADDERALL) 15 MG tablet    Sig: Take 1 tablet by mouth daily.    Dispense:  60 tablet    Refill:  0    Do not fill until June 2018  . amphetamine-dextroamphetamine (ADDERALL) 15 MG tablet    Sig: Take 1 tablet by mouth 2 (two) times daily.     Dispense:  60 tablet    Refill:  0    Do not fill until July 2018  . Estradiol (VAGIFEM) 10 MCG TABS vaginal tablet    Sig: 1 tab pv daily x 2 weeks then decrease to 1 tab 2x a week    Dispense:  18 tablet    Refill:  11    Problem List Items Addressed This Visit      Unprioritized   ADD (attention deficit disorder) - Primary   Relevant Medications   amphetamine-dextroamphetamine (ADDERALL) 15 MG tablet   amphetamine-dextroamphetamine (ADDERALL) 15 MG tablet   amphetamine-dextroamphetamine (ADDERALL) 15 MG tablet    Other Visit Diagnoses    Menopause       Relevant Medications   Estradiol (VAGIFEM) 10 MCG TABS vaginal tablet      Follow-up: Return in about 6 months (around 06/05/2017) for annual exam, fasting.  Grayling Congress  Zola Button, DO

## 2016-12-05 ENCOUNTER — Encounter: Payer: Self-pay | Admitting: Family Medicine

## 2016-12-05 DIAGNOSIS — F909 Attention-deficit hyperactivity disorder, unspecified type: Secondary | ICD-10-CM

## 2016-12-06 MED ORDER — AMPHETAMINE-DEXTROAMPHETAMINE 15 MG PO TABS: 15.0000 mg | ORAL_TABLET | Freq: Two times a day (BID) | ORAL | 0 refills | Status: DC

## 2017-01-07 ENCOUNTER — Other Ambulatory Visit: Payer: Self-pay | Admitting: Family Medicine

## 2017-03-04 ENCOUNTER — Telehealth: Payer: Self-pay | Admitting: Family Medicine

## 2017-03-04 DIAGNOSIS — F909 Attention-deficit hyperactivity disorder, unspecified type: Secondary | ICD-10-CM

## 2017-03-04 NOTE — Telephone Encounter (Signed)
Self.  Refill for 3 Rx for ADDERALL 15 MG twice a day Rx.  CB: [phone]

## 2017-03-05 NOTE — Telephone Encounter (Signed)
Last ov 12/03/16 Last refill 12/03/16 May, June, July #60 0 Last UDS 12/28/14, no controlled substance contract signed. Please advise. LB

## 2017-03-05 NOTE — Telephone Encounter (Signed)
Database Need uds and contract Ok to print 3 months

## 2017-03-08 MED ORDER — AMPHETAMINE-DEXTROAMPHETAMINE 15 MG PO TABS: 15.0000 mg | ORAL_TABLET | Freq: Every day | ORAL | 0 refills | Status: DC

## 2017-03-08 NOTE — Telephone Encounter (Signed)
Pt aware Rx is ready at the front/thx dmf 

## 2017-03-08 NOTE — Telephone Encounter (Signed)
Database printed; placed on ledge.

## 2017-03-08 NOTE — Telephone Encounter (Signed)
Please inform Pt that Rx has been placed at front desk for pick up at her convenience. Thank you.  

## 2017-03-08 NOTE — Telephone Encounter (Signed)
Refill x1 

## 2017-03-08 NOTE — Telephone Encounter (Signed)
Rx printed, awaiting DO signature.  

## 2017-03-08 NOTE — Telephone Encounter (Signed)
Database  

## 2017-04-29 ENCOUNTER — Other Ambulatory Visit: Payer: Self-pay | Admitting: Family Medicine

## 2017-06-10 ENCOUNTER — Encounter: Payer: Self-pay | Admitting: Family Medicine

## 2017-06-10 ENCOUNTER — Ambulatory Visit (INDEPENDENT_AMBULATORY_CARE_PROVIDER_SITE_OTHER): Payer: 59 | Admitting: Family Medicine

## 2017-06-10 ENCOUNTER — Telehealth: Payer: Self-pay | Admitting: Family Medicine

## 2017-06-10 ENCOUNTER — Other Ambulatory Visit (HOSPITAL_COMMUNITY)
Admission: RE | Admit: 2017-06-10 | Discharge: 2017-06-10 | Disposition: A | Discharge status: Home / Self Care | Payer: 59 | Source: Ambulatory Visit | Attending: Family Medicine | Admitting: Family Medicine

## 2017-06-10 VITALS — BP 150/92 | HR 70 | Temp 98.0°F | Ht 65.6 in | Wt 158.0 lb

## 2017-06-10 DIAGNOSIS — Z0000 Encounter for general adult medical examination without abnormal findings: Secondary | ICD-10-CM | POA: Diagnosis not present

## 2017-06-10 DIAGNOSIS — E2839 Other primary ovarian failure: Secondary | ICD-10-CM

## 2017-06-10 DIAGNOSIS — Z1231 Encounter for screening mammogram for malignant neoplasm of breast: Secondary | ICD-10-CM | POA: Diagnosis not present

## 2017-06-10 DIAGNOSIS — Z124 Encounter for screening for malignant neoplasm of cervix: Secondary | ICD-10-CM | POA: Diagnosis not present

## 2017-06-10 DIAGNOSIS — Z780 Asymptomatic menopausal state: Secondary | ICD-10-CM

## 2017-06-10 DIAGNOSIS — Z1239 Encounter for other screening for malignant neoplasm of breast: Secondary | ICD-10-CM

## 2017-06-10 DIAGNOSIS — F909 Attention-deficit hyperactivity disorder, unspecified type: Secondary | ICD-10-CM | POA: Diagnosis not present

## 2017-06-10 DIAGNOSIS — L659 Nonscarring hair loss, unspecified: Secondary | ICD-10-CM

## 2017-06-10 DIAGNOSIS — I10 Essential (primary) hypertension: Secondary | ICD-10-CM

## 2017-06-10 LAB — POC URINALSYSI DIPSTICK (AUTOMATED)
BILIRUBIN UA: NEGATIVE
GLUCOSE UA: NEGATIVE
KETONES UA: NEGATIVE
Leukocytes, UA: NEGATIVE
Nitrite, UA: NEGATIVE
PH UA: 6 (ref 5.0–8.0)
Protein, UA: NEGATIVE
RBC UA: NEGATIVE
SPEC GRAV UA: 1.025 (ref 1.010–1.025)
Urobilinogen, UA: 0.2 E.U./dL

## 2017-06-10 MED ORDER — AMPHETAMINE-DEXTROAMPHETAMINE 15 MG PO TABS: 15.0000 mg | ORAL_TABLET | Freq: Two times a day (BID) | ORAL | 0 refills | Status: DC

## 2017-06-10 MED ORDER — ESTRADIOL 2 MG VA RING: 2.0000 mg | VAGINAL_RING | VAGINAL | 3 refills | Status: DC

## 2017-06-10 MED ORDER — MINOXIDIL 2 % EX SOLN: Freq: Two times a day (BID) | CUTANEOUS | 3 refills | Status: DC

## 2017-06-10 NOTE — Telephone Encounter (Signed)
Patient notified that labs are done at [facility] and just the pap that goes thru [facility].  If her ins is in network with our office then lab is in network.

## 2017-06-10 NOTE — Progress Notes (Signed)
Subjective:     Regina [name] is a 51 y.o. female and is here for a comprehensive physical exam. The patient reports problems - she would like to switch back to estring.  she is also c/o hair loss and would like to try minoxidil for women. Pt also needs med refills  Social History   Socioeconomic History  . Marital status: Married    Spouse name: Not on file  . Number of children: Not on file  . Years of education: Not on file  . Highest education level: Not on file  Social Needs  . Financial resource strain: Not on file  . Food insecurity - worry: Not on file  . Food insecurity - inability: Not on file  . Transportation needs - medical: Not on file  . Transportation needs - non-medical: Not on file  Occupational History  . 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 Smoking status: Former Games developermoker  . Smokeless tobacco: Never Used  . Tobacco comment: quit 30 yrs ago   Substance and Sexual Activity  . Alcohol use: No    Comment: Rarely  . Drug use: No  . Sexual activity: Not Currently    Partners: Male  Other Topics Concern  . Not on file  Social History Narrative  . Not on file   Health Maintenance  Topic Date Due  . HIV Screening  08/12/1979  . MAMMOGRAM  07/16/2016  . INFLUENZA VACCINE  06/15/2018 (Originally 02/13/2017)  . PAP SMEAR  06/15/2017  . TETANUS/TDAP  01/28/2020  . COLONOSCOPY  04/12/2026    The following portions of the patient's history were reviewed and updated as appropriate:  She  has a past medical history of Allergy, Anxiety, Hypertension, Migraines, Osteoarthritis of neck, and Scoliosis. She does not have any pertinent problems on file. She  has a past surgical history that includes Lyphoma. Her family history includes Alcohol abuse in her mother; Diabetes in her mother; Emotional abuse in her mother; Hypertension in her mother. She  reports that she has quit smoking. she has never used smokeless tobacco. She reports that she does not  drink alcohol or use drugs. She has a current medication list which includes the following prescription(s): amphetamine-dextroamphetamine, biotin, black cohosh, calcium carbonate, estradiol, hydrochlorothiazide, krill oil, lutein-zeaxanthin, magnesium, minoxidil, turmeric, vitamin a, and ergocalciferol, and the following Facility-Administered Medications: sodium chloride. Current Outpatient Medications on File Prior to Visit  Medication Sig Dispense Refill  . Biotin 2500 MCG CAPS Take by mouth.    Marland Kitchen. BLACK COHOSH PO Take 540 mg by mouth daily.    . calcium carbonate (OSCAL) 1500 (600 Ca) MG TABS tablet Take 600 mg of elemental calcium by mouth 2 (two) times daily with a meal.    . hydrochlorothiazide (HYDRODIURIL) 12.5 MG tablet Take 1 tablet (12.5 mg total) by mouth daily. 90 tablet 3  . KRILL OIL 1000 MG CAPS Take 1 capsule by mouth daily.    . Lutein-Zeaxanthin 15-0.7 MG CAPS Take by mouth.    . Magnesium 200 MG TABS Take 1 tablet by mouth daily.    . Turmeric 500 MG CAPS Take by mouth.    . vitamin A [id number] UNIT capsule Take 25,000 Units by mouth daily.    Marland Kitchen. VITAMIN D, ERGOCALCIFEROL, PO Take 2,000 mg by mouth daily.      Current Facility-Administered Medications on File Prior to Visit  Medication Dose Route Frequency Provider Last Rate Last Dose  . 0.9 %  sodium chloride infusion  500 mL Intravenous Continuous Iva BoopGessner, Carl E, MD       She is allergic to lisinopril..  Review of Systems Review of Systems  Constitutional: Negative for activity change, appetite change and fatigue.  HENT: Negative for hearing loss, congestion, tinnitus and ear discharge.  dentist q694m Eyes: Negative for visual disturbance (see optho q1y -- vision corrected to 20/20 with glasses).  Respiratory: Negative for cough, chest tightness and shortness of breath.   Cardiovascular: Negative for chest pain, palpitations and leg swelling.  Gastrointestinal: Negative for abdominal pain, diarrhea, constipation and  abdominal distention.  Genitourinary: Negative for urgency, frequency, decreased urine volume and difficulty urinating.  Musculoskeletal: Negative for back pain, arthralgias and gait problem.  Skin: Negative for color change, pallor and rash.  Neurological: Negative for dizziness, light-headedness, numbness and headaches.  Hematological: Negative for adenopathy. Does not bruise/bleed easily.  Psychiatric/Behavioral: Negative for suicidal ideas, confusion, sleep disturbance, self-injury, dysphoric mood, decreased concentration and agitation.       Objective:    BP (!) 150/92   Pulse 70   Temp 98 F (36.7 C) (Oral)   Ht 5' 5.6" (1.666 m)   Wt 158 lb (71.7 kg)   LMP 04/29/2010   BMI 25.81 kg/m  General appearance: alert, cooperative, appears stated age and no distress Head: Normocephalic, without obvious abnormality, atraumatic Eyes: conjunctivae/corneas clear. PERRL, EOM's intact. Fundi benign. Ears: normal TM's and external ear canals both ears Nose: Nares normal. Septum midline. Mucosa normal. No drainage or sinus tenderness. Throat: lips, mucosa, and tongue normal; teeth and gums normal Neck: no adenopathy, no carotid bruit, no JVD, supple, symmetrical, trachea midline and thyroid not enlarged, symmetric, no tenderness/mass/nodules Back: symmetric, no curvature. ROM normal. No CVA tenderness. Lungs: clear to auscultation bilaterally Breasts: normal appearance, no masses or tenderness Heart: regular rate and rhythm, S1, S2 normal, no murmur, click, rub or gallop Abdomen: soft, non-tender; bowel sounds normal; no masses,  no organomegaly Pelvic: cervix normal in appearance, external genitalia normal, no adnexal masses or tenderness, no cervical motion tenderness, rectovaginal septum normal, urethra without abnormality or discharge, uterus normal size, shape, and consistency, vagina normal without discharge and pap done, rectal heme neg brown stool Extremities: extremities normal,  atraumatic, no cyanosis or edema Pulses: 2+ and symmetric Skin: Skin color, texture, turgor normal. No rashes or lesions Lymph nodes: Cervical, supraclavicular, and axillary nodes normal. Neurologic: Alert and oriented X 3, normal strength and tone. Normal symmetric reflexes. Normal coordination and gait    Assessment:    Healthy female exam.      Plan:    ghm utd  Check labs  See After Visit Summary for Counseling Recommendations    1. Breast cancer screening  - MM DIAG BREAST TOMO BILATERAL; Future  2. Estrogen deficiency  - DG Bone Density; Future - estradiol (ESTRING) 2 MG vaginal ring; Place 2 mg vaginally every 3 (three) months. follow package directions  Dispense: 1 each; Refill: 3  3. Preventative health care See above - Lipid panel - TSH - CBC with Differential/Platelet - Comprehensive metabolic panel - POCT Urinalysis Dipstick (Automated) - Nicotine and Cotinine LC/MS/MS, U  4. Attention deficit hyperactivity disorder (ADHD), unspecified ADHD type Refill meds for now-- bp running high -- - amphetamine-dextroamphetamine (ADDERALL) 15 MG tablet; Take 1 tablet by mouth 2 (two) times daily.  Dispense: 180 tablet; Refill: 0  5. Menopause rx estring - estradiol (ESTRING) 2 MG vaginal ring; Place 2 mg vaginally every 3 (three) months. follow package directions  Dispense:  1 each; Refill: 3  6. Hair loss Check labs - minoxidil (ROGAINE) 2 % external solution; Apply topically 2 (two) times daily.  Dispense: 60 mL; Refill: 3  7. Screening for cervical cancer  - Cytology - PAP  8. HTN-- inc norvasc to 5 mg

## 2017-06-10 NOTE — Telephone Encounter (Signed)
Pt would like to change where her labs go to [facility] or [facility]. Insurance does not cover [facility]. [employer]  Please advise CB: [phone]

## 2017-06-10 NOTE — Addendum Note (Signed)
Addended by: Thelma BargeICHARDSON, Amyriah Buras D on: 06/10/2017 05:37 PM   Modules accepted: Orders

## 2017-06-10 NOTE — Patient Instructions (Signed)
Preventive Care 40-64 Years, Female Preventive care refers to lifestyle choices and visits with your health care provider that can promote health and wellness. What does preventive care include?  A yearly physical exam. This is also called an annual well check.  Dental exams once or twice a year.  Routine eye exams. Ask your health care provider how often you should have your eyes checked.  Personal lifestyle choices, including: ? Daily care of your teeth and gums. ? Regular physical activity. ? Eating a healthy diet. ? Avoiding tobacco and drug use. ? Limiting alcohol use. ? Practicing safe sex. ? Taking low-dose aspirin daily starting at age 58. ? Taking vitamin and mineral supplements as recommended by your health care provider. What happens during an annual well check? The services and screenings done by your health care provider during your annual well check will depend on your age, overall health, lifestyle risk factors, and family history of disease. Counseling Your health care provider may ask you questions about your:  Alcohol use.  Tobacco use.  Drug use.  Emotional well-being.  Home and relationship well-being.  Sexual activity.  Eating habits.  Work and work Statistician.  Method of birth control.  Menstrual cycle.  Pregnancy history.  Screening You may have the following tests or measurements:  Height, weight, and BMI.  Blood pressure.  Lipid and cholesterol levels. These may be checked every 5 years, or more frequently if you are over 81 years old.  Skin check.  Lung cancer screening. You may have this screening every year starting at age 78 if you have a 30-pack-year history of smoking and currently smoke or have quit within the past 15 years.  Fecal occult blood test (FOBT) of the stool. You may have this test every year starting at age 65.  Flexible sigmoidoscopy or colonoscopy. You may have a sigmoidoscopy every 5 years or a colonoscopy  every 10 years starting at age 30.  Hepatitis C blood test.  Hepatitis B blood test.  Sexually transmitted disease (STD) testing.  Diabetes screening. This is done by checking your blood sugar (glucose) after you have not eaten for a while (fasting). You may have this done every 1-3 years.  Mammogram. This may be done every 1-2 years. Talk to your health care provider about when you should start having regular mammograms. This may depend on whether you have a family history of breast cancer.  BRCA-related cancer screening. This may be done if you have a family history of breast, ovarian, tubal, or peritoneal cancers.  Pelvic exam and Pap test. This may be done every 3 years starting at age 80. Starting at age 36, this may be done every 5 years if you have a Pap test in combination with an HPV test.  Bone density scan. This is done to screen for osteoporosis. You may have this scan if you are at high risk for osteoporosis.  Discuss your test results, treatment options, and if necessary, the need for more tests with your health care provider. Vaccines Your health care provider may recommend certain vaccines, such as:  Influenza vaccine. This is recommended every year.  Tetanus, diphtheria, and acellular pertussis (Tdap, Td) vaccine. You may need a Td booster every 10 years.  Varicella vaccine. You may need this if you have not been vaccinated.  Zoster vaccine. You may need this after age 5.  Measles, mumps, and rubella (MMR) vaccine. You may need at least one dose of MMR if you were born in  1958 or later. You may also need a second dose.  Pneumococcal 13-valent conjugate (PCV13) vaccine. You may need this if you have certain conditions and were not previously vaccinated.  Pneumococcal polysaccharide (PPSV23) vaccine. You may need one or two doses if you smoke cigarettes or if you have certain conditions.  Meningococcal vaccine. You may need this if you have certain  conditions.  Hepatitis A vaccine. You may need this if you have certain conditions or if you travel or work in places where you may be exposed to hepatitis A.  Hepatitis B vaccine. You may need this if you have certain conditions or if you travel or work in places where you may be exposed to hepatitis B.  Haemophilus influenzae type b (Hib) vaccine. You may need this if you have certain conditions.  Talk to your health care provider about which screenings and vaccines you need and how often you need them. This information is not intended to replace advice given to you by your health care provider. Make sure you discuss any questions you have with your health care provider. Document Released: 07/29/2015 Document Revised: 03/21/2016 Document Reviewed: 05/03/2015 Elsevier Interactive Patient Education  2017 [employer].

## 2017-06-10 NOTE — Assessment & Plan Note (Signed)
Inc norvasc to 5 mg daily Recheck 2-3 weeks

## 2017-06-12 LAB — CYTOLOGY - PAP
DIAGNOSIS: NEGATIVE
HPV: NOT DETECTED

## 2017-06-17 ENCOUNTER — Other Ambulatory Visit (INDEPENDENT_AMBULATORY_CARE_PROVIDER_SITE_OTHER): Payer: 59

## 2017-06-17 ENCOUNTER — Other Ambulatory Visit: Payer: Self-pay | Admitting: Family Medicine

## 2017-06-17 DIAGNOSIS — Z0000 Encounter for general adult medical examination without abnormal findings: Secondary | ICD-10-CM

## 2017-06-17 DIAGNOSIS — E119 Type 2 diabetes mellitus without complications: Secondary | ICD-10-CM

## 2017-06-17 DIAGNOSIS — R739 Hyperglycemia, unspecified: Secondary | ICD-10-CM

## 2017-06-17 DIAGNOSIS — E785 Hyperlipidemia, unspecified: Secondary | ICD-10-CM

## 2017-06-17 LAB — CBC WITH DIFFERENTIAL/PLATELET
BASOS ABS: 0 10*3/uL (ref 0.0–0.1)
Basophils Relative: 0.7 % (ref 0.0–3.0)
EOS ABS: 0.1 10*3/uL (ref 0.0–0.7)
Eosinophils Relative: 1.6 % (ref 0.0–5.0)
HEMATOCRIT: 44.4 % (ref 36.0–46.0)
HEMOGLOBIN: 14.7 g/dL (ref 12.0–15.0)
LYMPHS PCT: 37.2 % (ref 12.0–46.0)
Lymphs Abs: 1.9 10*3/uL (ref 0.7–4.0)
MCHC: 33.2 g/dL (ref 30.0–36.0)
MCV: 94.8 fl (ref 78.0–100.0)
Monocytes Absolute: 0.4 10*3/uL (ref 0.1–1.0)
Monocytes Relative: 8 % (ref 3.0–12.0)
Neutro Abs: 2.7 10*3/uL (ref 1.4–7.7)
Neutrophils Relative %: 52.5 % (ref 43.0–77.0)
Platelets: 239 10*3/uL (ref 150.0–400.0)
RBC: 4.69 Mil/uL (ref 3.87–5.11)
RDW: 12.8 % (ref 11.5–15.5)
WBC: 5.2 10*3/uL (ref 4.0–10.5)

## 2017-06-17 LAB — COMPREHENSIVE METABOLIC PANEL
ALBUMIN: 4.5 g/dL (ref 3.5–5.2)
ALT: 25 U/L (ref 0–35)
AST: 21 U/L (ref 0–37)
Alkaline Phosphatase: 96 U/L (ref 39–117)
BILIRUBIN TOTAL: 0.7 mg/dL (ref 0.2–1.2)
BUN: 13 mg/dL (ref 6–23)
CALCIUM: 10.2 mg/dL (ref 8.4–10.5)
CO2: 30 mEq/L (ref 19–32)
CREATININE: 0.94 mg/dL (ref 0.40–1.20)
Chloride: 105 mEq/L (ref 96–112)
GFR: 66.24 mL/min (ref >60.00)
Glucose, Bld: 111 mg/dL — ABNORMAL HIGH (ref 70–99)
Potassium: 4.8 mEq/L (ref 3.5–5.1)
Sodium: 143 mEq/L (ref 135–145)
Total Protein: 7.3 g/dL (ref 6.0–8.3)

## 2017-06-17 LAB — TSH: TSH: 1.76 u[IU]/mL (ref 0.35–4.50)

## 2017-06-17 LAB — LIPID PANEL
CHOL/HDL RATIO: 4
Cholesterol: 200 mg/dL (ref 0–200)
HDL: 47.2 mg/dL (ref >39.00)
LDL Cholesterol: 126 mg/dL — ABNORMAL HIGH (ref 0–99)
NONHDL: 152.64
TRIGLYCERIDES: 134 mg/dL (ref 0.0–149.0)
VLDL: 26.8 mg/dL (ref 0.0–40.0)

## 2017-06-20 LAB — NICOTINE AND COTININE LC/MS/MS, U: COTININE, URINE: <2 ng/mL

## 2017-06-26 ENCOUNTER — Encounter: Payer: Self-pay | Admitting: Family Medicine

## 2017-06-28 ENCOUNTER — Other Ambulatory Visit: Payer: Self-pay | Admitting: *Deleted

## 2017-06-28 MED ORDER — HYDROCHLOROTHIAZIDE 12.5 MG PO TABS: 12.5000 mg | ORAL_TABLET | Freq: Every day | ORAL | 1 refills | Status: DC

## 2017-08-08 ENCOUNTER — Other Ambulatory Visit: Payer: Self-pay | Admitting: Family Medicine

## 2017-08-10 ENCOUNTER — Encounter: Payer: Self-pay | Admitting: Family Medicine

## 2017-08-14 NOTE — Telephone Encounter (Signed)
Most of the [employer] (losartan and others) are on recall  Could try toprol xl 50 mg  #30  1 po qd, 2 refills Check bp 2-3 weeks

## 2017-08-19 ENCOUNTER — Telehealth: Payer: Self-pay | Admitting: Family Medicine

## 2017-08-19 ENCOUNTER — Ambulatory Visit
Admission: RE | Admit: 2017-08-19 | Discharge: 2017-08-19 | Disposition: A | Discharge status: Home / Self Care | Payer: 59 | Source: Ambulatory Visit | Attending: Family Medicine | Admitting: Family Medicine

## 2017-08-19 DIAGNOSIS — Z780 Asymptomatic menopausal state: Secondary | ICD-10-CM | POA: Diagnosis not present

## 2017-08-19 DIAGNOSIS — M8589 Other specified disorders of bone density and structure, multiple sites: Secondary | ICD-10-CM | POA: Diagnosis not present

## 2017-08-19 DIAGNOSIS — Z1239 Encounter for other screening for malignant neoplasm of breast: Secondary | ICD-10-CM

## 2017-08-19 DIAGNOSIS — Z1231 Encounter for screening mammogram for malignant neoplasm of breast: Secondary | ICD-10-CM | POA: Diagnosis not present

## 2017-08-19 DIAGNOSIS — E2839 Other primary ovarian failure: Secondary | ICD-10-CM

## 2017-08-19 NOTE — Telephone Encounter (Signed)
Copied from CRM [id number]. Topic: Quick Communication - Rx Refill/Question >> Aug 19, 2017 10:29 AM Arlyss Gandyichardson, Honest Vanleer N, NT wrote: Medication: amLODipine (NORVASC) would like 5mg  instead of 2.5mg  and needs refill of Losartan   Has the patient contacted their pharmacy? Yes.     (Agent: If no, request that the patient contact the pharmacy for the refill.)   Preferred Pharmacy (with phone number or street name): [facility] on [address]   Agent: Please be advised that RX refills may take up to 3 business days. We ask that you follow-up with your pharmacy.

## 2017-08-19 NOTE — Telephone Encounter (Signed)
Left message for pt. To call back and discuss why she wants her Norvasc increased.

## 2017-08-20 ENCOUNTER — Other Ambulatory Visit: Payer: Self-pay | Admitting: Family Medicine

## 2017-08-20 DIAGNOSIS — I10 Essential (primary) hypertension: Secondary | ICD-10-CM

## 2017-08-20 MED ORDER — AMLODIPINE BESYLATE 5 MG PO TABS: 5.0000 mg | ORAL_TABLET | Freq: Every day | ORAL | 0 refills | Status: DC

## 2017-08-20 MED ORDER — LOSARTAN POTASSIUM 50 MG PO TABS: 25.0000 mg | ORAL_TABLET | Freq: Every day | ORAL | 0 refills | Status: DC

## 2017-08-20 MED ORDER — AMLODIPINE BESYLATE 5 MG PO TABS: 5.0000 mg | ORAL_TABLET | Freq: Every day | ORAL | 3 refills | Status: DC

## 2017-08-20 NOTE — Telephone Encounter (Signed)
These notes are very confusing.  I'm not sure why ----  Because there are notes on everything I inc to 5 mg norvasc

## 2017-08-20 NOTE — Telephone Encounter (Signed)
[facility] [address] Also she said she needs the losartan filled too. She did not want these to go to [facility] bc she is currently out and has been out for 2 days.

## 2017-08-20 NOTE — Addendum Note (Signed)
Addended by: Thelma BargeICHARDSON, Wiley Magan D on: 08/20/2017 04:48 PM   Modules accepted: Orders

## 2017-08-20 NOTE — Telephone Encounter (Signed)
Patient calling back, has not heard anything, would like a update. Please advise, call back [phone]

## 2017-08-20 NOTE — Telephone Encounter (Signed)
rxs sent in and patient notified. 

## 2017-08-20 NOTE — Telephone Encounter (Signed)
Patient called [phone], left VM to call office back to clarify the request for Losartan. This medication is not listed on her medication profile. Also advised [facility], enough to get her through until her refills arrive from the mail order.

## 2017-08-20 NOTE — Telephone Encounter (Signed)
Rxs sent in and patient notified. °

## 2017-08-20 NOTE — Telephone Encounter (Signed)
Did you want to inc amlodipine to 5mg ?

## 2017-08-30 ENCOUNTER — Other Ambulatory Visit: Payer: Self-pay | Admitting: Family Medicine

## 2017-08-30 NOTE — Telephone Encounter (Signed)
Request this medication be combined into one pill.   Cozaar and Hydrodiuril.  Thanks.  [facility], [place]

## 2017-08-30 NOTE — Telephone Encounter (Signed)
Copied from CRM [phone]. Topic: Quick Communication - Rx Refill/Question >> Aug 30, 2017  7:52 AM Gerrianne ScalePayne, Cierah Crader L wrote: Medication: losartan (COZAAR) 50 MG tablet   hydrochlorothiazide (HYDRODIURIL) 12.5 MG tablet    patient would like the two medications combined into one    Has the patient contacted their pharmacy? No   (Agent: If no, request that the patient contact the pharmacy for the refill.)   [facility]erred [facility]with phone number or street [facility] [place] [id number] [phone] (Phone) [phone] (Fax)     Agent: Please be advised that RX refills may take up to 3 business days. We ask that you follow-up with your pharmacy.

## 2017-08-31 ENCOUNTER — Encounter: Payer: Self-pay | Admitting: Family Medicine

## 2017-09-02 MED ORDER — LOSARTAN POTASSIUM-HCTZ 50-12.5 MG PO TABS: 1.0000 | ORAL_TABLET | Freq: Every day | ORAL | 1 refills | Status: DC

## 2017-09-02 NOTE — Telephone Encounter (Signed)
Dr Lowne Chase-- please advise? 

## 2017-09-02 NOTE — Addendum Note (Signed)
Addended by: Thelma BargeICHARDSON, SHEKETIA D on: 09/02/2017 02:02 PM   Modules accepted: Orders

## 2017-09-02 NOTE — Telephone Encounter (Signed)
See 08/31/17 pt email.

## 2017-09-02 NOTE — Telephone Encounter (Signed)
Sent mychart message

## 2017-09-02 NOTE — Telephone Encounter (Signed)
It looks like 5 mg 1 po qd was sent on the 6th --- - ok to resend if they did not

## 2017-09-02 NOTE — Telephone Encounter (Signed)
Ok to chang to hyzaar 50 / 12.5  1 po qd,  #90  1 refills

## 2017-09-23 ENCOUNTER — Encounter: Payer: Self-pay | Admitting: Family Medicine

## 2017-09-23 ENCOUNTER — Ambulatory Visit (INDEPENDENT_AMBULATORY_CARE_PROVIDER_SITE_OTHER): Payer: 59 | Admitting: Family Medicine

## 2017-09-23 VITALS — BP 134/72 | HR 60 | Temp 97.6°F | Resp 16 | Ht 65.5 in | Wt 157.6 lb

## 2017-09-23 DIAGNOSIS — R739 Hyperglycemia, unspecified: Secondary | ICD-10-CM | POA: Insufficient documentation

## 2017-09-23 DIAGNOSIS — I10 Essential (primary) hypertension: Secondary | ICD-10-CM | POA: Diagnosis not present

## 2017-09-23 DIAGNOSIS — M8580 Other specified disorders of bone density and structure, unspecified site: Secondary | ICD-10-CM | POA: Insufficient documentation

## 2017-09-23 DIAGNOSIS — Z79899 Other long term (current) drug therapy: Secondary | ICD-10-CM

## 2017-09-23 DIAGNOSIS — E785 Hyperlipidemia, unspecified: Secondary | ICD-10-CM | POA: Diagnosis not present

## 2017-09-23 DIAGNOSIS — F909 Attention-deficit hyperactivity disorder, unspecified type: Secondary | ICD-10-CM

## 2017-09-23 LAB — COMPREHENSIVE METABOLIC PANEL
ALBUMIN: 4.2 g/dL (ref 3.5–5.2)
ALT: 20 U/L (ref 0–35)
AST: 16 U/L (ref 0–37)
Alkaline Phosphatase: 90 U/L (ref 39–117)
BUN: 14 mg/dL (ref 6–23)
CALCIUM: 9.6 mg/dL (ref 8.4–10.5)
CHLORIDE: 106 meq/L (ref 96–112)
CO2: 29 meq/L (ref 19–32)
Creatinine, Ser: 0.81 mg/dL (ref 0.40–1.20)
GFR: 78.58 mL/min (ref >60.00)
Glucose, Bld: 100 mg/dL — ABNORMAL HIGH (ref 70–99)
POTASSIUM: 4.3 meq/L (ref 3.5–5.1)
SODIUM: 139 meq/L (ref 135–145)
Total Bilirubin: 0.5 mg/dL (ref 0.2–1.2)
Total Protein: 6.9 g/dL (ref 6.0–8.3)

## 2017-09-23 LAB — LIPID PANEL
CHOL/HDL RATIO: 3
CHOLESTEROL: 152 mg/dL (ref 0–200)
HDL: 47.6 mg/dL (ref >39.00)
LDL CALC: 86 mg/dL (ref 0–99)
NonHDL: 104.6
TRIGLYCERIDES: 91 mg/dL (ref 0.0–149.0)
VLDL: 18.2 mg/dL (ref 0.0–40.0)

## 2017-09-23 LAB — HIGH SENSITIVITY CRP: CRP HIGH SENSITIVITY: 0.72 mg/L (ref 0.000–5.000)

## 2017-09-23 LAB — MAGNESIUM: MAGNESIUM: 2.5 mg/dL (ref 1.5–2.5)

## 2017-09-23 LAB — HEMOGLOBIN A1C: Hgb A1c MFr Bld: 5.7 % (ref 4.6–6.5)

## 2017-09-23 MED ORDER — AMPHETAMINE-DEXTROAMPHETAMINE 15 MG PO TABS: 15.0000 mg | ORAL_TABLET | Freq: Two times a day (BID) | ORAL | 0 refills | Status: DC

## 2017-09-23 NOTE — Assessment & Plan Note (Signed)
Well controlled, no changes to meds. Encouraged heart healthy diet such as the DASH diet and exercise as tolerated.  °

## 2017-09-23 NOTE — Assessment & Plan Note (Signed)
Check labs  Watch simple sugars and starches  

## 2017-09-23 NOTE — Assessment & Plan Note (Signed)
Encouraged heart healthy diet, increase exercise, avoid trans fats, consider a krill oil cap daily 

## 2017-09-23 NOTE — Progress Notes (Signed)
Subjective:  I acted as a Neurosurgeonscribe for Regina Energy CorporationDr.Lowne-[name]. Regina [name], RMA   Patient ID: Regina [name], female    DOB: 11-07-1965, 52 y.o.   MRN: [id number]  Chief Complaint  Patient presents with  . Follow-up    HPI  Patient is in today for follow up visit.    Pt is requesting crp , vita d and mag be added to her labs .   She also needs refill on her adderall.     Patient Care Team: Regina ButtonLowne [name], Regina CongressYvonne R, DO as PCP - General (Family Medicine)   Past Medical History:  Diagnosis Date  . Allergy   . Anxiety   . Hypertension   . Migraines    Hormonal  . Osteoarthritis of neck   . Scoliosis     Past Surgical History:  Procedure Laterality Date  . Lyphoma      Family History  Problem Relation Age of Onset  . Hypertension Mother   . Diabetes Mother   . Emotional abuse Mother   . Alcohol abuse Mother   . Colon cancer Neg Hx   . Colon polyps Neg Hx   . Rectal cancer Neg Hx   . Stomach cancer Neg Hx   . Breast cancer Neg Hx     Social History   Socioeconomic History  . Marital status: Married    Spouse name: Not on file  . Number of children: Not on file  . Years of education: Not on file  . Highest education level: Not on file  Social Needs  . Financial resource strain: Not on file  . Food insecurity - worry: Not on file  . Food insecurity - inability: Not on file  . Transportation needs - medical: Not on file  . Transportation needs - non-medical: Not on file  Occupational History  . 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 Smoking status: Former Games developermoker  . Smokeless tobacco: Never Used  . Tobacco comment: quit 30 yrs ago   Substance and Sexual Activity  . Alcohol use: No    Comment: Rarely  . Drug use: No  . Sexual activity: Not Currently    Partners: Male  Other Topics Concern  . Not on file  Social History Narrative  . Not on file    Outpatient Medications Prior to Visit  Medication Sig Dispense Refill  . amLODipine (NORVASC) 5 MG  tablet TAKE 1 TABLET(5 MG) BY MOUTH DAILY 90 tablet 0  . Biotin 2500 MCG CAPS Take by mouth.    Marland Kitchen. BLACK COHOSH PO Take 540 mg by mouth daily.    . calcium carbonate (OSCAL) 1500 (600 Ca) MG TABS tablet Take 600 mg of elemental calcium by mouth 2 (two) times daily with a meal.    . estradiol (ESTRING) 2 MG vaginal ring Place 2 mg vaginally every 3 (three) months. follow package directions 1 each 3  . KRILL OIL 1000 MG CAPS Take 1 capsule by mouth daily.    Marland Kitchen. losartan-hydrochlorothiazide (HYZAAR) 50-12.5 MG tablet Take 1 tablet by mouth daily. 90 tablet 1  . Lutein-Zeaxanthin 15-0.7 MG CAPS Take by mouth.    . Magnesium 200 MG TABS Take 1 tablet by mouth daily.    . minoxidil (ROGAINE) 2 % external solution Apply topically 2 (two) times daily. 60 mL 3  . Turmeric 500 MG CAPS Take by mouth.    . vitamin A [id number] UNIT capsule Take 25,000 Units by mouth daily.    .Marland Kitchen  VITAMIN D, ERGOCALCIFEROL, PO Take 2,000 mg by mouth daily.     Marland Kitchen amphetamine-dextroamphetamine (ADDERALL) 15 MG tablet Take 1 tablet by mouth 2 (two) times daily. 180 tablet 0   Facility-Administered Medications Prior to Visit  Medication Dose Route Frequency Provider Last Rate Last Dose  . 0.9 %  sodium chloride infusion  500 mL Intravenous Continuous Iva Boop, MD        Allergies  Allergen Reactions  . Lisinopril Nausea And Vomiting    Review of Systems  Constitutional: Negative for chills, fever and malaise/fatigue.  HENT: Negative for congestion and hearing loss.   Eyes: Negative for discharge.  Respiratory: Negative for cough, sputum production and shortness of breath.   Cardiovascular: Negative for chest pain, palpitations and leg swelling.  Gastrointestinal: Negative for abdominal pain, blood in stool, constipation, diarrhea, heartburn, nausea and vomiting.  Genitourinary: Negative for dysuria, frequency, hematuria and urgency.  Musculoskeletal: Negative for back pain, falls and myalgias.  Skin: Negative for  rash.  Neurological: Negative for dizziness, sensory change, loss of consciousness, weakness and headaches.  Endo/Heme/Allergies: Negative for environmental allergies. Does not bruise/bleed easily.  Psychiatric/Behavioral: Negative for depression and suicidal ideas. The patient is not nervous/anxious and does not have insomnia.        Objective:    Physical Exam  Constitutional: She is oriented to person, place, and time. She appears well-developed and well-nourished.  HENT:  Head: Normocephalic and atraumatic.  Eyes: Conjunctivae and EOM are normal.  Neck: Normal range of motion. Neck supple. No JVD present. Carotid bruit is not present. No thyromegaly present.  Cardiovascular: Normal rate, regular rhythm and normal heart sounds.  No murmur heard. Pulmonary/Chest: Effort normal and breath sounds normal. No respiratory distress. She has no wheezes. She has no rales. She exhibits no tenderness.  Musculoskeletal: She exhibits no edema.  Neurological: She is alert and oriented to person, place, and time.  Psychiatric: She has a normal mood and affect.  Nursing note and vitals reviewed.   BP 134/72 (BP Location: Left Arm, Patient Position: Sitting, Cuff Size: Normal)   Pulse 60   Temp 97.6 F (36.4 C) (Oral)   Resp 16   Ht 5' 5.5" (1.664 m)   Wt 157 lb 9.6 oz (71.5 kg)   LMP 04/29/2010   SpO2 96%   BMI 25.83 kg/m  Wt Readings from Last 3 Encounters:  09/23/17 157 lb 9.6 oz (71.5 kg)  06/10/17 158 lb (71.7 kg)  12/03/16 152 lb 9.6 oz (69.2 kg)   BP Readings from Last 3 Encounters:  09/23/17 134/72  06/10/17 (!) 150/92  12/03/16 128/82     Immunization History  Administered Date(s) Administered  . Influenza Inj Mdck Quad Pf 05/26/2016  . Influenza,inj,Quad PF,6+ Mos 04/09/2013  . Influenza-Unspecified 06/15/2014, 06/16/2015  . PPD Test 04/09/2013  . Tdap 01/27/2010    Health Maintenance  Topic Date Due  . HEMOGLOBIN A1C  18[birth year]Jan[birth year]1965  . PNEUMOCOCCAL POLYSACCHARIDE  VACCINE (1) 08/11/1966  . FOOT EXAM  08/11/1974  . OPHTHALMOLOGY EXAM  08/11/1974  . HIV Screening  08/12/1979  . INFLUENZA VACCINE  06/15/2018 (Originally 02/13/2017)  . MAMMOGRAM  08/20/2019  . TETANUS/TDAP  01/28/2020  . PAP SMEAR  06/10/2020  . COLONOSCOPY  04/12/2026    Lab Results  Component Value Date   WBC 5.2 06/17/2017   HGB 14.7 06/17/2017   HCT 44.4 06/17/2017   PLT 239.0 06/17/2017   GLUCOSE 111 (H) 06/17/2017   CHOL 200 06/17/2017   TRIG  134.0 06/17/2017   HDL 47.20 06/17/2017   LDLCALC 126 (H) 06/17/2017   ALT 25 06/17/2017   AST 21 06/17/2017   NA 143 06/17/2017   K 4.8 06/17/2017   CL 105 06/17/2017   CREATININE 0.94 06/17/2017   BUN 13 06/17/2017   CO2 30 06/17/2017   TSH 1.76 06/17/2017    Lab Results  Component Value Date   TSH 1.76 06/17/2017   Lab Results  Component Value Date   WBC 5.2 06/17/2017   HGB 14.7 06/17/2017   HCT 44.4 06/17/2017   MCV 94.8 06/17/2017   PLT 239.0 06/17/2017   Lab Results  Component Value Date   NA 143 06/17/2017   K 4.8 06/17/2017   CO2 30 06/17/2017   GLUCOSE 111 (H) 06/17/2017   BUN 13 06/17/2017   CREATININE 0.94 06/17/2017   BILITOT 0.7 06/17/2017   ALKPHOS 96 06/17/2017   AST 21 06/17/2017   ALT 25 06/17/2017   PROT 7.3 06/17/2017   ALBUMIN 4.5 06/17/2017   CALCIUM 10.2 06/17/2017   GFR 66.24 06/17/2017   Lab Results  Component Value Date   CHOL 200 06/17/2017   Lab Results  Component Value Date   HDL 47.20 06/17/2017   Lab Results  Component Value Date   LDLCALC 126 (H) 06/17/2017   Lab Results  Component Value Date   TRIG 134.0 06/17/2017   Lab Results  Component Value Date   CHOLHDL 4 06/17/2017   No results found for: HGBA1C       Assessment & Plan:   Problem List Items Addressed This Visit      Unprioritized   ADD (attention deficit disorder)    con't adderall-- pt will try to back down to one a day We are trying to wean her off      Relevant Medications    amphetamine-dextroamphetamine (ADDERALL) 15 MG tablet   Other Relevant Orders   Pain Mgmt, Profile 8 w/Conf, U   HTN (hypertension) - Primary    Well controlled, no changes to meds. Encouraged heart healthy diet such as the DASH diet and exercise as tolerated.       Relevant Orders   Magnesium   High sensitivity CRP   Hyperglycemia    Check labs Watch simple sugars and starches       Hyperlipidemia    Encouraged heart healthy diet, increase exercise, avoid trans fats, consider a krill oil cap daily      Osteopenia   Relevant Orders   Vitamin D (25 hydroxy)    Other Visit Diagnoses    High risk medication use       Relevant Orders   Pain Mgmt, Profile 8 w/Conf, U      I am having Regina [name] maintain her Krill Oil, Lutein-Zeaxanthin, (VITAMIN D, ERGOCALCIFEROL, PO), BLACK COHOSH PO, Turmeric, Biotin, vitamin A, Magnesium, calcium carbonate, estradiol, minoxidil, amLODipine, losartan-hydrochlorothiazide, and amphetamine-dextroamphetamine. We will continue to administer sodium chloride.  Meds ordered this encounter  Medications  . amphetamine-dextroamphetamine (ADDERALL) 15 MG tablet    Sig: Take 1 tablet by mouth 2 (two) times daily.    Dispense:  180 tablet    Refill:  0    CMA served as scribe during this visit. History, Physical and Plan performed by medical provider. Documentation and orders reviewed and attested to.  Donato Schultz, DO

## 2017-09-23 NOTE — Patient Instructions (Signed)
DASH Eating Plan DASH stands for "Dietary Approaches to Stop Hypertension." The DASH eating plan is a healthy eating plan that has been shown to reduce high blood pressure (hypertension). It may also reduce your risk for type 2 diabetes, heart disease, and stroke. The DASH eating plan may also help with weight loss. What are tips for following this plan? General guidelines  Avoid eating more than 2,300 mg (milligrams) of salt (sodium) a day. If you have hypertension, you may need to reduce your sodium intake to 1,500 mg a day.  Limit alcohol intake to no more than 1 drink a day for nonpregnant women and 2 drinks a day for men. One drink equals 12 oz of beer, 5 oz of wine, or 1 oz of hard liquor.  Work with your health care provider to maintain a healthy body weight or to lose weight. Ask what an ideal weight is for you.  Get at least 30 minutes of exercise that causes your heart to beat faster (aerobic exercise) most days of the week. Activities may include walking, swimming, or biking.  Work with your health care provider or diet and nutrition specialist (dietitian) to adjust your eating plan to your individual calorie needs. Reading food labels  Check food labels for the amount of sodium per serving. Choose foods with less than 5 percent of the Daily Value of sodium. Generally, foods with less than 300 mg of sodium per serving fit into this eating plan.  To find whole grains, look for the word "whole" as the first word in the ingredient list. Shopping  Buy products labeled as "low-sodium" or "no salt added."  Buy fresh foods. Avoid canned foods and premade or frozen meals. Cooking  Avoid adding salt when cooking. Use salt-free seasonings or herbs instead of table salt or sea salt. Check with your health care provider or pharmacist before using salt substitutes.  Do not fry foods. Cook foods using healthy methods such as baking, boiling, grilling, and broiling instead.  Cook with  heart-healthy oils, such as olive, canola, soybean, or sunflower oil. Meal planning   Eat a balanced diet that includes: ? 5 or more servings of fruits and vegetables each day. At each meal, try to fill half of your plate with fruits and vegetables. ? Up to 6-8 servings of whole grains each day. ? Less than 6 oz of lean meat, poultry, or fish each day. A 3-oz serving of meat is about the same size as a deck of cards. One egg equals 1 oz. ? 2 servings of low-fat dairy each day. ? A serving of nuts, seeds, or beans 5 times each week. ? Heart-healthy fats. Healthy fats called Omega-3 fatty acids are found in foods such as flaxseeds and coldwater fish, like sardines, salmon, and mackerel.  Limit how much you eat of the following: ? Canned or prepackaged foods. ? Food that is high in trans fat, such as fried foods. ? Food that is high in saturated fat, such as fatty meat. ? Sweets, desserts, sugary drinks, and other foods with added sugar. ? Full-fat dairy products.  Do not salt foods before eating.  Try to eat at least 2 vegetarian meals each week.  Eat more home-cooked food and less restaurant, buffet, and fast food.  When eating at a restaurant, ask that your food be prepared with less salt or no salt, if possible. What foods are recommended? The items listed may not be a complete list. Talk with your dietitian about what   dietary choices are best for you. Grains Whole-grain or whole-wheat bread. Whole-grain or whole-wheat pasta. Brown rice. Oatmeal. Quinoa. Bulgur. Whole-grain and low-sodium cereals. Pita bread. Low-fat, low-sodium crackers. Whole-wheat flour tortillas. Vegetables Fresh or frozen vegetables (raw, steamed, roasted, or grilled). Low-sodium or reduced-sodium tomato and vegetable juice. Low-sodium or reduced-sodium tomato sauce and tomato paste. Low-sodium or reduced-sodium canned vegetables. Fruits All fresh, dried, or frozen fruit. Canned fruit in natural juice (without  added sugar). Meat and other protein foods Skinless chicken or turkey. Ground chicken or turkey. Pork with fat trimmed off. Fish and seafood. Egg whites. Dried beans, peas, or lentils. Unsalted nuts, nut butters, and seeds. Unsalted canned beans. Lean cuts of beef with fat trimmed off. Low-sodium, lean deli meat. Dairy Low-fat (1%) or fat-free (skim) milk. Fat-free, low-fat, or reduced-fat cheeses. Nonfat, low-sodium ricotta or cottage cheese. Low-fat or nonfat yogurt. Low-fat, low-sodium cheese. Fats and oils Soft margarine without trans fats. Vegetable oil. Low-fat, reduced-fat, or light mayonnaise and salad dressings (reduced-sodium). Canola, safflower, olive, soybean, and sunflower oils. Avocado. Seasoning and other foods Herbs. Spices. Seasoning mixes without salt. Unsalted popcorn and pretzels. Fat-free sweets. What foods are not recommended? The items listed may not be a complete list. Talk with your dietitian about what dietary choices are best for you. Grains Baked goods made with fat, such as croissants, muffins, or some breads. Dry pasta or rice meal packs. Vegetables Creamed or fried vegetables. Vegetables in a cheese sauce. Regular canned vegetables (not low-sodium or reduced-sodium). Regular canned tomato sauce and paste (not low-sodium or reduced-sodium). Regular tomato and vegetable juice (not low-sodium or reduced-sodium). Pickles. Olives. Fruits Canned fruit in a light or heavy syrup. Fried fruit. Fruit in cream or butter sauce. Meat and other protein foods Fatty cuts of meat. Ribs. Fried meat. Bacon. Sausage. Bologna and other processed lunch meats. Salami. Fatback. Hotdogs. Bratwurst. Salted nuts and seeds. Canned beans with added salt. Canned or smoked fish. Whole eggs or egg yolks. Chicken or turkey with skin. Dairy Whole or 2% milk, cream, and half-and-half. Whole or full-fat cream cheese. Whole-fat or sweetened yogurt. Full-fat cheese. Nondairy creamers. Whipped toppings.  Processed cheese and cheese spreads. Fats and oils Butter. Stick margarine. Lard. Shortening. Ghee. Bacon fat. Tropical oils, such as coconut, palm kernel, or palm oil. Seasoning and other foods Salted popcorn and pretzels. Onion salt, garlic salt, seasoned salt, table salt, and sea salt. Worcestershire sauce. Tartar sauce. Barbecue sauce. Teriyaki sauce. Soy sauce, including reduced-sodium. Steak sauce. Canned and packaged gravies. Fish sauce. Oyster sauce. Cocktail sauce. Horseradish that you find on the shelf. Ketchup. Mustard. Meat flavorings and tenderizers. Bouillon cubes. Hot sauce and Tabasco sauce. Premade or packaged marinades. Premade or packaged taco seasonings. Relishes. Regular salad dressings. Where to find more information:  National Heart, Lung, and Blood Institute: www.nhlbi.nih.gov  American Heart Association: www.heart.org Summary  The DASH eating plan is a healthy eating plan that has been shown to reduce high blood pressure (hypertension). It may also reduce your risk for type 2 diabetes, heart disease, and stroke.  With the DASH eating plan, you should limit salt (sodium) intake to 2,300 mg a day. If you have hypertension, you may need to reduce your sodium intake to 1,500 mg a day.  When on the DASH eating plan, aim to eat more fresh fruits and vegetables, whole grains, lean proteins, low-fat dairy, and heart-healthy fats.  Work with your health care provider or diet and nutrition specialist (dietitian) to adjust your eating plan to your individual   calorie needs. This information is not intended to replace advice given to you by your health care provider. Make sure you discuss any questions you have with your health care provider. Document Released: 06/21/2011 Document Revised: 06/25/2016 Document Reviewed: 06/25/2016 Elsevier Interactive Patient Education  2018 Elsevier Inc.  

## 2017-09-23 NOTE — Assessment & Plan Note (Signed)
con't adderall-- pt will try to back down to one a day We are trying to wean her off

## 2017-09-24 LAB — VITAMIN D 25 HYDROXY (VIT D DEFICIENCY, FRACTURES): VITD: 37.74 ng/mL (ref 30.00–100.00)

## 2017-09-24 MED ORDER — ZOSTER VAC RECOMB ADJUVANTED 50 MCG/0.5ML IM SUSR: 0.5000 mL | Freq: Once | INTRAMUSCULAR | 1 refills | Status: AC

## 2017-09-25 LAB — PAIN MGMT, PROFILE 8 W/CONF, U
6 ACETYLMORPHINE: NEGATIVE ng/mL (ref <10)
ALCOHOL METABOLITES: NEGATIVE ng/mL (ref <500)
Amphetamine: 967 ng/mL — ABNORMAL HIGH (ref <250)
Amphetamines: POSITIVE ng/mL — AB (ref <500)
BENZODIAZEPINES: NEGATIVE ng/mL (ref <100)
BUPRENORPHINE, URINE: NEGATIVE ng/mL (ref <5)
COCAINE METABOLITE: NEGATIVE ng/mL (ref <150)
Creatinine: 24.6 mg/dL
MDMA: NEGATIVE ng/mL (ref <500)
Marijuana Metabolite: NEGATIVE ng/mL (ref <20)
Methamphetamine: NEGATIVE ng/mL (ref <250)
OPIATES: NEGATIVE ng/mL (ref <100)
Oxidant: NEGATIVE ug/mL (ref <200)
Oxycodone: NEGATIVE ng/mL (ref <100)
pH: 6.81 (ref 4.5–9.0)

## 2017-10-17 ENCOUNTER — Encounter: Payer: Self-pay | Admitting: Family Medicine

## 2017-10-18 ENCOUNTER — Other Ambulatory Visit: Payer: Self-pay | Admitting: *Deleted

## 2017-10-18 DIAGNOSIS — Z780 Asymptomatic menopausal state: Secondary | ICD-10-CM

## 2017-10-18 DIAGNOSIS — E2839 Other primary ovarian failure: Secondary | ICD-10-CM

## 2017-10-18 MED ORDER — ESTRADIOL 2 MG VA RING: 2.0000 mg | VAGINAL_RING | VAGINAL | 3 refills | Status: DC

## 2017-10-30 ENCOUNTER — Telehealth: Payer: Self-pay | Admitting: Family Medicine

## 2017-10-30 DIAGNOSIS — Z780 Asymptomatic menopausal state: Secondary | ICD-10-CM

## 2017-10-30 DIAGNOSIS — E2839 Other primary ovarian failure: Secondary | ICD-10-CM

## 2017-10-30 MED ORDER — ESTRADIOL 2 MG VA RING: 2.0000 mg | VAGINAL_RING | VAGINAL | 2 refills | Status: DC

## 2017-10-30 NOTE — Addendum Note (Signed)
Addended by: Thelma BargeICHARDSON, Xoey Warmoth D on: 10/30/2017 08:33 AM   Modules accepted: Orders

## 2017-10-30 NOTE — Telephone Encounter (Signed)
Copied from CRM [phone]. Topic: Quick Communication - See Telephone Encounter >> Oct 30, 2017  8:03 AM Eston Mouldavis, Launa Goedken B wrote: CRM for notification. See Telephone encounter for: 10/30/17.  Pt Requesting  that office  Mail  written rx  for estradiol (ESTRING) 2 MG vaginal ringest,   she is having trouble using   coupon  she needs to take to another pharmacy

## 2017-10-30 NOTE — Telephone Encounter (Signed)
rx printed.  Patient notified that rx will be sent out tomorrow because we have to wait for Dr. Hulan SaasLownes signature.

## 2017-10-31 NOTE — Telephone Encounter (Signed)
rx mailed

## 2017-11-13 ENCOUNTER — Other Ambulatory Visit: Payer: Self-pay | Admitting: Family Medicine

## 2017-12-17 ENCOUNTER — Encounter: Payer: Self-pay | Admitting: Family Medicine

## 2017-12-19 ENCOUNTER — Encounter: Payer: Self-pay | Admitting: Family Medicine

## 2017-12-19 ENCOUNTER — Other Ambulatory Visit: Payer: Self-pay | Admitting: Family Medicine

## 2017-12-19 DIAGNOSIS — Z780 Asymptomatic menopausal state: Secondary | ICD-10-CM

## 2017-12-19 MED ORDER — ESTRADIOL 10 MCG VA TABS: ORAL_TABLET | VAGINAL | 2 refills | Status: DC

## 2017-12-19 MED ORDER — ESTRADIOL 10 MCG VA TABS: ORAL_TABLET | VAGINAL | 3 refills | Status: DC

## 2018-01-16 ENCOUNTER — Other Ambulatory Visit: Payer: Self-pay | Admitting: Family Medicine

## 2018-01-16 DIAGNOSIS — I10 Essential (primary) hypertension: Secondary | ICD-10-CM

## 2018-01-20 ENCOUNTER — Other Ambulatory Visit: Payer: Self-pay | Admitting: Family Medicine

## 2018-01-20 DIAGNOSIS — F909 Attention-deficit hyperactivity disorder, unspecified type: Secondary | ICD-10-CM

## 2018-01-20 NOTE — Telephone Encounter (Signed)
There should be something in data base --- sometimes its under a different name or dates are different

## 2018-01-20 NOTE — Telephone Encounter (Signed)
Requesting: Adderall 15mg  bid Contract: yes   UDS: 09/23/17 Last OV: 09/23/17 Next Ov: N/A Last refill: 09/23/17, #180, 0RF Database: none found  Order pended.

## 2018-01-21 ENCOUNTER — Encounter: Payer: Self-pay | Admitting: Family Medicine

## 2018-01-21 MED ORDER — AMPHETAMINE-DEXTROAMPHETAMINE 15 MG PO TABS: 15.0000 mg | ORAL_TABLET | Freq: Two times a day (BID) | ORAL | 0 refills | Status: DC

## 2018-01-21 NOTE — Telephone Encounter (Signed)
Database printed and on desk for review.

## 2018-01-27 NOTE — Telephone Encounter (Signed)
Pt called stating she was just notified by [facility] this morning that they are out of the 15mg  tablet but they have the 30mg  tablet in stock. Pt asking if RX can be changed to 1/2 tablets 2x per day. Pt is completely out and it has to go thru [facility]. Can RX be sent today if possible.

## 2018-01-28 ENCOUNTER — Encounter: Payer: Self-pay | Admitting: Family Medicine

## 2018-01-28 ENCOUNTER — Other Ambulatory Visit: Payer: Self-pay | Admitting: Family Medicine

## 2018-01-28 DIAGNOSIS — F988 Other specified behavioral and emotional disorders with onset usually occurring in childhood and adolescence: Secondary | ICD-10-CM

## 2018-01-28 MED ORDER — AMPHETAMINE-DEXTROAMPHETAMINE 30 MG PO TABS: 30.0000 mg | ORAL_TABLET | Freq: Every day | ORAL | 0 refills | Status: DC

## 2018-01-29 ENCOUNTER — Encounter: Payer: Self-pay | Admitting: Family Medicine

## 2018-01-31 ENCOUNTER — Other Ambulatory Visit: Payer: Self-pay | Admitting: Family Medicine

## 2018-01-31 MED ORDER — AMPHETAMINE-DEXTROAMPHETAMINE 30 MG PO TABS: 15.0000 mg | ORAL_TABLET | Freq: Two times a day (BID) | ORAL | 0 refills | Status: DC

## 2018-01-31 NOTE — Telephone Encounter (Signed)
done

## 2018-03-12 ENCOUNTER — Other Ambulatory Visit: Payer: Self-pay | Admitting: Family Medicine

## 2018-03-12 DIAGNOSIS — I10 Essential (primary) hypertension: Secondary | ICD-10-CM

## 2018-03-14 ENCOUNTER — Other Ambulatory Visit: Payer: Self-pay | Admitting: *Deleted

## 2018-05-04 ENCOUNTER — Other Ambulatory Visit: Payer: Self-pay | Admitting: Family Medicine

## 2018-05-04 DIAGNOSIS — F909 Attention-deficit hyperactivity disorder, unspecified type: Secondary | ICD-10-CM

## 2018-05-05 MED ORDER — AMPHETAMINE-DEXTROAMPHETAMINE 15 MG PO TABS: 15.0000 mg | ORAL_TABLET | Freq: Two times a day (BID) | ORAL | 0 refills | Status: DC

## 2018-05-05 MED ORDER — LOSARTAN POTASSIUM-HCTZ 50-12.5 MG PO TABS: 1.0000 | ORAL_TABLET | Freq: Every day | ORAL | 1 refills | Status: DC

## 2018-05-05 NOTE — Telephone Encounter (Signed)
Pt is requesting refill on Adderall.   Last OV: 01/28/2018 Last Fill: 01/21/2018 #180 and 0RF UDS: 09/23/2017 Low risk

## 2018-05-15 DIAGNOSIS — Z6824 Body mass index (BMI) 24.0-24.9, adult: Secondary | ICD-10-CM | POA: Diagnosis not present

## 2018-05-15 DIAGNOSIS — J309 Allergic rhinitis, unspecified: Secondary | ICD-10-CM | POA: Diagnosis not present

## 2018-05-15 DIAGNOSIS — J069 Acute upper respiratory infection, unspecified: Secondary | ICD-10-CM | POA: Diagnosis not present

## 2018-06-17 ENCOUNTER — Ambulatory Visit: Payer: 59 | Admitting: Family Medicine

## 2018-06-17 ENCOUNTER — Ambulatory Visit (INDEPENDENT_AMBULATORY_CARE_PROVIDER_SITE_OTHER): Payer: 59 | Admitting: Family Medicine

## 2018-06-17 ENCOUNTER — Encounter: Payer: Self-pay | Admitting: Family Medicine

## 2018-06-17 VITALS — BP 130/64 | HR 63 | Temp 98.0°F | Resp 16 | Ht 66.0 in | Wt 150.6 lb

## 2018-06-17 DIAGNOSIS — F909 Attention-deficit hyperactivity disorder, unspecified type: Secondary | ICD-10-CM | POA: Diagnosis not present

## 2018-06-17 DIAGNOSIS — Z79899 Other long term (current) drug therapy: Secondary | ICD-10-CM | POA: Diagnosis not present

## 2018-06-17 DIAGNOSIS — E785 Hyperlipidemia, unspecified: Secondary | ICD-10-CM | POA: Diagnosis not present

## 2018-06-17 DIAGNOSIS — I10 Essential (primary) hypertension: Secondary | ICD-10-CM

## 2018-06-17 MED ORDER — HYDROCHLOROTHIAZIDE 12.5 MG PO CAPS: 12.5000 mg | ORAL_CAPSULE | Freq: Every day | ORAL | 1 refills | Status: DC

## 2018-06-17 MED ORDER — LOSARTAN POTASSIUM-HCTZ 50-12.5 MG PO TABS: 1.0000 | ORAL_TABLET | Freq: Every day | ORAL | 1 refills | Status: DC

## 2018-06-17 MED ORDER — AMPHETAMINE-DEXTROAMPHETAMINE 15 MG PO TABS
15.0000 mg | ORAL_TABLET | Freq: Every day | ORAL | 0 refills | Status: DC
Start: 2018-06-17 — End: 2018-06-18

## 2018-06-17 MED ORDER — AMPHETAMINE-DEXTROAMPHETAMINE 15 MG PO TABS: 15.0000 mg | ORAL_TABLET | Freq: Two times a day (BID) | ORAL | 0 refills | Status: DC

## 2018-06-17 MED ORDER — AMPHETAMINE-DEXTROAMPHETAMINE 15 MG PO TABS: 15.0000 mg | ORAL_TABLET | Freq: Every day | ORAL | 0 refills | Status: DC

## 2018-06-17 NOTE — Assessment & Plan Note (Signed)
Encouraged heart healthy diet, increase exercise, avoid trans fats, consider a krill oil cap daily 

## 2018-06-17 NOTE — Assessment & Plan Note (Signed)
Stable. Refill meds

## 2018-06-17 NOTE — Assessment & Plan Note (Signed)
Well controlled, no changes to meds. Encouraged heart healthy diet such as the DASH diet and exercise as tolerated.  °

## 2018-06-17 NOTE — Patient Instructions (Signed)

## 2018-06-17 NOTE — Progress Notes (Signed)
Patient ID: Regina [name], female    DOB: [birth year]  Age: 52 y.o. MRN: [id number]    Subjective:  Subjective   Regina [name] presents for f/u bp, chol and adhd.  Pt knows she will eventually need to stop the adderall due to bp    Review of Systems  Constitutional: Negative for appetite change, diaphoresis, fatigue and unexpected weight change.  Eyes: Negative for pain, redness and visual disturbance.  Respiratory: Negative for cough, chest tightness, shortness of breath and wheezing.   Cardiovascular: Negative for chest pain, palpitations and leg swelling.  Endocrine: Negative for cold intolerance, heat intolerance, polydipsia, polyphagia and polyuria.  Genitourinary: Negative for difficulty urinating, dysuria and frequency.  Neurological: Negative for dizziness, light-headedness, numbness and headaches.  Psychiatric/Behavioral: Negative for agitation, behavioral problems and decreased concentration. The patient is not nervous/anxious.     History Past Medical History:  Diagnosis Date  . Allergy   . Anxiety   . Hypertension   . Migraines    Hormonal  . Osteoarthritis of neck   . Scoliosis     She has a past surgical history that includes Lyphoma.   Her family history includes Alcohol abuse in her mother; Diabetes in her mother; Emotional abuse in her mother; Hypertension in her mother.She reports that she has quit smoking. She has never used smokeless tobacco. She reports that she does not drink alcohol or use drugs.  Current Outpatient Medications on File Prior to Visit  Medication Sig Dispense Refill  . amLODipine (NORVASC) 5 MG tablet TAKE 1 TABLET(5 MG) BY MOUTH DAILY 90 tablet 0  . Biotin 40981 MCG TABS Take 1 tablet by mouth daily.    Marland Kitchen BLACK COHOSH PO Take 540 mg by mouth daily.    Marland Kitchen estradiol (ESTRING) 2 MG vaginal ring Place 2 mg vaginally every 3 (three) months. follow package directions 1 each 3  . Estradiol (VAGIFEM) 10 MCG TABS vaginal tablet 1 pv 2x   A week 24 tablet 3  . KRILL OIL 1000 MG CAPS Take 1 capsule by mouth daily.    . Lutein-Zeaxanthin 15-0.7 MG CAPS Take by mouth.    . Magnesium 500 MG CAPS Take 1 capsule by mouth daily.    . minoxidil (ROGAINE) 2 % external solution Apply topically 2 (two) times daily. 60 mL 3  . TURMERIC PO Take 1,500 mg by mouth daily.    . vitamin A 19147 UNIT capsule Take 25,000 Units by mouth daily.    Marland Kitchen VITAMIN D, ERGOCALCIFEROL, PO Take 2,000 mg by mouth daily.      Current Facility-Administered Medications on File Prior to Visit  Medication Dose Route Frequency Provider Last Rate Last Dose  . 0.9 %  sodium chloride infusion  500 mL Intravenous Continuous Regina Boop, MD         Objective:  Objective  Physical Exam  Constitutional: She is oriented to person, place, and time. She appears well-developed and well-nourished.  HENT:  Head: Normocephalic and atraumatic.  Eyes: Conjunctivae and EOM are normal.  Neck: Normal range of motion. Neck supple. No JVD present. Carotid bruit is not present. No thyromegaly present.  Cardiovascular: Normal rate, regular rhythm and normal heart sounds.  No murmur heard. Pulmonary/Chest: Effort normal and breath sounds normal. No respiratory distress. She has no wheezes. She has no rales. She exhibits no tenderness.  Musculoskeletal: She exhibits no edema.  Neurological: She is alert and oriented to person, place, and time.  Psychiatric: She has a normal  mood and affect.  Nursing note and vitals reviewed.  BP 130/64 (BP Location: Left Arm, Cuff Size: Normal)   Pulse 63   Temp 98 F (36.7 [name] (Oral)   Resp 16   Ht 5\' 6"  (1.676 m)   Wt 150 lb 9.6 oz (68.3 kg)   LMP 04/29/2010   SpO2 100%   BMI 24.31 kg/m  Wt Readings from Last 3 Encounters:  06/17/18 150 lb 9.6 oz (68.3 kg)  09/23/17 157 lb 9.6 oz (71.5 kg)  06/10/17 158 lb (71.7 kg)     Lab Results  Component Value Date   WBC 5.2 06/17/2017   HGB 14.7 06/17/2017   HCT 44.4 06/17/2017   PLT  239.0 06/17/2017   GLUCOSE 100 (H) 09/23/2017   CHOL 152 09/23/2017   TRIG 91.0 09/23/2017   HDL 47.60 09/23/2017   LDLCALC 86 09/23/2017   ALT 20 09/23/2017   AST 16 09/23/2017   NA 139 09/23/2017   K 4.3 09/23/2017   CL 106 09/23/2017   CREATININE 0.81 09/23/2017   BUN 14 09/23/2017   CO2 29 09/23/2017   TSH 1.76 06/17/2017   HGBA1C 5.7 09/23/2017    Dg Bone Density  Result Date: 08/19/2017 EXAM: DUAL X-RAY ABSORPTIOMETRY (DXA) FOR BONE MINERAL DENSITY IMPRESSION: Referring Physician:  Lelon PerlaYVONNE R LOWNE [name] PATIENT: Name: Regina [name], Regina [name] Patient ID: [id number] Birth Date: 21-Aug-1965 Height: 65.5 in. Sex: Female Measured: 08/19/2017 Weight: 158.1 lbs. Indications: Caucasian, Estrogen Deficient, Postmenopausal Fractures: Rib Treatments: Calcium (E943.0), Estradiol, Vitamin D (E933.5) ASSESSMENT: The BMD measured at AP Spine L1-L4 is 0.987 g/cm2 with a T-score of -1.7. This patient is considered osteopenic according to World Health Organization [facility](WHO) criteria. Site Region Measured Date Measured Age YA BMD Significant CHANGE T-score AP Spine  L1-L4      08/19/2017    [age]         -1.7    0.987 g/cm2 DualFemur Neck Right 08/19/2017    52.0         -1.1    0.883 g/cm2 World Health Organization [facility](WHO) criteria for post-menopausal, Caucasian Women: Normal       T-score at or above -1 SD Osteopenia   T-score between -1 and -2.5 SD Osteoporosis T-score at or below -2.5 SD RECOMMENDATION: National Osteoporosis Foundation recommends that FDA-approved medical therapies be considered in postmenopausal women and men age 53 or older with a: 1. Hip or vertebral (clinical or morphometric) fracture. 2. T-score of <-2.5 at the spine or hip. 3. Ten-year fracture probability by FRAX of 3% or greater for hip fracture or 20% or greater for major osteoporotic fracture. All treatment decisions require clinical judgment and consideration of individual patient factors, including patient preferences, co-morbidities,  previous drug use, risk factors not captured in the FRAX model (e.g. falls, vitamin D deficiency, increased bone turnover, interval significant decline in bone density) and possible under - or over-estimation of fracture risk by FRAX. All patients should ensure an adequate intake of dietary calcium (1200 mg/d) and vitamin D (800 IU daily) unless contraindicated. FOLLOW-UP: People with diagnosed cases of osteoporosis or at high risk for fracture should have regular bone mineral density tests. For patients eligible for Medicare, routine testing is allowed once every 2 years. The testing frequency can be increased to one year for patients who have rapidly progressing disease, those who are receiving or discontinuing medical therapy to restore bone mass, or have additional risk factors. I have reviewed this report, and agree with the above findings. [facility] FRAX* 10-year Probability of Fracture Based on femoral neck BMD: DualFemur (Right) Major Osteoporotic Fracture: 5.2% Hip Fracture:                0.3% Population:                  [place] (Caucasian) Risk Factors:                None *FRAX is a Armed forces logistics/support/administrative officer of the [employer] of [employer] for Metabolic Bone Disease, a World Science writer (WHO) [employer]. ASSESSMENT: The probability of a major osteoporotic fracture is 5.2 % within the next ten years. The probability of hip fracture is 0.3   % within the next 10 years. Electronically Signed   By: Charlett Nose M.D.   On: 08/19/2017 08:25   Mm Screening Breast Tomo Bilateral  Result Date: 08/19/2017 CLINICAL DATA:  Screening. EXAM: 2D DIGITAL SCREENING BILATERAL MAMMOGRAM WITH 3D TOMO WITH CAD COMPARISON:  Previous exam(s). ACR Breast Density Category [name]: The breast tissue is heterogeneously dense, which may obscure small masses. FINDINGS: There are no findings suspicious for malignancy. Images were processed with CAD. IMPRESSION: No mammographic evidence of malignancy. A  result letter of this screening mammogram will be mailed directly to the patient. RECOMMENDATION: Screening mammogram in one year. (Code:SM-B-01Y) BI-RADS CATEGORY  1: Negative. Electronically Signed   By: Annia Belt M.D.   On: 08/19/2017 11:09     Assessment & Plan:  Plan  I have discontinued Regina [name]'s Turmeric and calcium carbonate. I am also having her start on amphetamine-dextroamphetamine and amphetamine-dextroamphetamine. Additionally, I am having her maintain her Krill Oil, Lutein-Zeaxanthin, (VITAMIN D, ERGOCALCIFEROL, PO), BLACK COHOSH PO, vitamin A, minoxidil, amLODipine, estradiol, Estradiol, Biotin, Magnesium, TURMERIC PO, losartan-hydrochlorothiazide, hydrochlorothiazide, and amphetamine-dextroamphetamine. We will continue to administer sodium chloride.  Meds ordered this encounter  Medications  . DISCONTD: losartan-hydrochlorothiazide (HYZAAR) 50-12.5 MG tablet    Sig: Take 1 tablet by mouth daily.    Dispense:  90 tablet    Refill:  1  . DISCONTD: hydrochlorothiazide (MICROZIDE) 12.5 MG capsule    Sig: Take 1 capsule (12.5 mg total) by mouth daily.    Dispense:  90 capsule    Refill:  1  . losartan-hydrochlorothiazide (HYZAAR) 50-12.5 MG tablet    Sig: Take 1 tablet by mouth daily.    Dispense:  90 tablet    Refill:  1  . hydrochlorothiazide (MICROZIDE) 12.5 MG capsule    Sig: Take 1 capsule (12.5 mg total) by mouth daily.    Dispense:  90 capsule    Refill:  1  . amphetamine-dextroamphetamine (ADDERALL) 15 MG tablet    Sig: Take 1 tablet by mouth 2 (two) times daily.    Dispense:  60 tablet    Refill:  0    Do not fill until Feb 2020  . amphetamine-dextroamphetamine (ADDERALL) 15 MG tablet    Sig: Take 1 tablet by mouth daily.    Dispense:  60 tablet    Refill:  0    Do not fill until Jan 2020  . amphetamine-dextroamphetamine (ADDERALL) 15 MG tablet    Sig: Take 1 tablet by mouth daily.    Dispense:  60 tablet    Refill:  0    Problem List Items  Addressed This Visit      Unprioritized   ADD (attention deficit disorder)    Stable Refill meds       Relevant Medications   losartan-hydrochlorothiazide (  HYZAAR) 50-12.5 MG tablet   amphetamine-dextroamphetamine (ADDERALL) 15 MG tablet   amphetamine-dextroamphetamine (ADDERALL) 15 MG tablet   amphetamine-dextroamphetamine (ADDERALL) 15 MG tablet   HTN (hypertension)    Well controlled, no changes to meds. Encouraged heart healthy diet such as the DASH diet and exercise as tolerated.       Relevant Medications   losartan-hydrochlorothiazide (HYZAAR) 50-12.5 MG tablet   hydrochlorothiazide (MICROZIDE) 12.5 MG capsule   Hyperlipidemia - Primary    Encouraged heart healthy diet, increase exercise, avoid trans fats, consider a krill oil cap daily      Relevant Medications   losartan-hydrochlorothiazide (HYZAAR) 50-12.5 MG tablet   hydrochlorothiazide (MICROZIDE) 12.5 MG capsule   Other Relevant Orders   Comprehensive metabolic panel   Lipid panel      Follow-up: Return in about 6 months (around 12/17/2018), or if symptoms worsen or fail to improve, for annual exam, fasting.  Donato Schultz, DO

## 2018-06-18 ENCOUNTER — Telehealth: Payer: Self-pay | Admitting: *Deleted

## 2018-06-18 ENCOUNTER — Encounter: Payer: Self-pay | Admitting: Family Medicine

## 2018-06-18 DIAGNOSIS — Z79899 Other long term (current) drug therapy: Secondary | ICD-10-CM | POA: Diagnosis not present

## 2018-06-18 LAB — LIPID PANEL
CHOL/HDL RATIO: 3
Cholesterol: 195 mg/dL (ref 0–200)
HDL: 65.4 mg/dL (ref >39.00)
LDL CALC: 114 mg/dL — AB (ref 0–99)
NonHDL: 129.33
TRIGLYCERIDES: 77 mg/dL (ref 0.0–149.0)
VLDL: 15.4 mg/dL (ref 0.0–40.0)

## 2018-06-18 LAB — COMPREHENSIVE METABOLIC PANEL
ALT: 17 U/L (ref 0–35)
AST: 16 U/L (ref 0–37)
Albumin: 4.8 g/dL (ref 3.5–5.2)
Alkaline Phosphatase: 92 U/L (ref 39–117)
BUN: 13 mg/dL (ref 6–23)
CHLORIDE: 102 meq/L (ref 96–112)
CO2: 30 mEq/L (ref 19–32)
CREATININE: 0.9 mg/dL (ref 0.40–1.20)
Calcium: 10.3 mg/dL (ref 8.4–10.5)
GFR: 69.39 mL/min (ref >60.00)
Glucose, Bld: 104 mg/dL — ABNORMAL HIGH (ref 70–99)
POTASSIUM: 4 meq/L (ref 3.5–5.1)
Sodium: 140 mEq/L (ref 135–145)
Total Bilirubin: 0.5 mg/dL (ref 0.2–1.2)
Total Protein: 7.4 g/dL (ref 6.0–8.3)

## 2018-06-18 NOTE — Addendum Note (Signed)
Addended by: Thelma BargeICHARDSON, Murray Guzzetta D on: 06/18/2018 08:00 AM   Modules accepted: Orders

## 2018-06-18 NOTE — Telephone Encounter (Signed)
We have a urine sample in the lab on this pt but there are no order in Epic for urine testing. Please advise and place appropriate orders if testing is still needed.

## 2018-06-18 NOTE — Telephone Encounter (Signed)
Copied from CRM [id number]. Topic: General - Other >> Jun 17, 2018  6:18 PM Marylen PontoMcneil, Ja-Kwan wrote: Reason for CRM: [facility] on the Rx for amphetamine-dextroamphetamine (ADDERALL) 15 MG tablet. Leamon stated 2 of the prescriptions direction is to take 1 tablet by mouth daily but 1 says take 1 tablet by mouth 2 (two) times daily. Leamons stated in the past patient has been taking 1 tablet two times daily. Cb# [phone]

## 2018-06-18 NOTE — Addendum Note (Signed)
Addended by: Thelma BargeICHARDSON, Ziomara Birenbaum D on: 06/18/2018 12:11 PM   Modules accepted: Orders

## 2018-06-18 NOTE — Telephone Encounter (Signed)
Ok to wait Dr. Hulan SaasLownes return  Called pharmacy.  They stated that they will be able to use the rx that was twice a day with Feb 2020 fill date.    I fixed the prescription sigs to day twice a day, so it will be easy to reorder.  We will need you to send in 2 rxs for Jan and Feb.

## 2018-06-18 NOTE — Telephone Encounter (Signed)
Order put in.

## 2018-06-19 ENCOUNTER — Other Ambulatory Visit: Payer: Self-pay | Admitting: Family Medicine

## 2018-06-19 ENCOUNTER — Other Ambulatory Visit (INDEPENDENT_AMBULATORY_CARE_PROVIDER_SITE_OTHER): Payer: 59

## 2018-06-19 DIAGNOSIS — F909 Attention-deficit hyperactivity disorder, unspecified type: Secondary | ICD-10-CM

## 2018-06-19 DIAGNOSIS — M8580 Other specified disorders of bone density and structure, unspecified site: Secondary | ICD-10-CM | POA: Diagnosis not present

## 2018-06-19 DIAGNOSIS — L709 Acne, unspecified: Secondary | ICD-10-CM

## 2018-06-19 DIAGNOSIS — M659 Synovitis and tenosynovitis, unspecified: Secondary | ICD-10-CM

## 2018-06-19 DIAGNOSIS — L403 Pustulosis palmaris et plantaris: Secondary | ICD-10-CM

## 2018-06-19 DIAGNOSIS — M869 Osteomyelitis, unspecified: Secondary | ICD-10-CM | POA: Diagnosis not present

## 2018-06-19 MED ORDER — AMPHETAMINE-DEXTROAMPHETAMINE 15 MG PO TABS
15.0000 mg | ORAL_TABLET | Freq: Two times a day (BID) | ORAL | 0 refills | Status: DC
Start: 2018-07-17 — End: 2021-06-28

## 2018-06-19 MED ORDER — AMPHETAMINE-DEXTROAMPHETAMINE 15 MG PO TABS: 15.0000 mg | ORAL_TABLET | Freq: Two times a day (BID) | ORAL | 0 refills | Status: DC

## 2018-06-19 NOTE — Telephone Encounter (Signed)
sheketia--- I got a new phone and con't do the impravata --- I m going to have shannon put in a ticket  I'm sitting with Dr Abner GreenspanBlyth and Carmelia RollerWendling and they said you can forward any controlled to them and they can do them

## 2018-06-19 NOTE — Telephone Encounter (Signed)
Dr. Laury AxonLowne route to you.

## 2018-06-19 NOTE — Telephone Encounter (Signed)
Sent in. If technology betrayed us, please pend for me to sign and I will send again. TY.

## 2018-06-20 LAB — VITAMIN D 25 HYDROXY (VIT D DEFICIENCY, FRACTURES): VITD: 43.69 ng/mL (ref 30.00–100.00)

## 2018-06-20 NOTE — Telephone Encounter (Signed)
Confirmed with pharmacist that rx was received for adderall.

## 2018-06-20 NOTE — Telephone Encounter (Signed)
Re-routed to Dr. Carmelia RollerWendling and Efraim KaufmannMelissa, NP DOD to attempt to re-send in absence of PCP.

## 2018-06-21 LAB — PAIN MGMT, PROFILE 8 W/CONF, U
6 Acetylmorphine: NEGATIVE ng/mL (ref <10)
AMPHETAMINE: 3581 ng/mL — AB (ref <250)
AMPHETAMINES: POSITIVE ng/mL — AB (ref <500)
Alcohol Metabolites: NEGATIVE ng/mL (ref <500)
BUPRENORPHINE, URINE: NEGATIVE ng/mL (ref <5)
Benzodiazepines: NEGATIVE ng/mL (ref <100)
CREATININE: 31.9 mg/dL
Cocaine Metabolite: NEGATIVE ng/mL (ref <150)
MARIJUANA METABOLITE: NEGATIVE ng/mL (ref <20)
MDMA: NEGATIVE ng/mL (ref <500)
Methamphetamine: NEGATIVE ng/mL (ref <250)
OXIDANT: NEGATIVE ug/mL (ref <200)
Opiates: NEGATIVE ng/mL (ref <100)
Oxycodone: NEGATIVE ng/mL (ref <100)
pH: 6.87 (ref 4.5–9.0)

## 2018-09-03 ENCOUNTER — Other Ambulatory Visit: Payer: Self-pay | Admitting: Family Medicine

## 2018-09-03 ENCOUNTER — Encounter: Payer: Self-pay | Admitting: Family Medicine

## 2018-09-03 DIAGNOSIS — F909 Attention-deficit hyperactivity disorder, unspecified type: Secondary | ICD-10-CM

## 2018-09-05 ENCOUNTER — Other Ambulatory Visit: Payer: Self-pay | Admitting: Family Medicine

## 2018-09-05 DIAGNOSIS — F909 Attention-deficit hyperactivity disorder, unspecified type: Secondary | ICD-10-CM

## 2018-09-05 MED ORDER — AMLODIPINE BESYLATE 5 MG PO TABS: 5.0000 mg | ORAL_TABLET | Freq: Every day | ORAL | 1 refills | Status: DC

## 2018-09-05 MED ORDER — AMPHETAMINE-DEXTROAMPHETAMINE 15 MG PO TABS: 15.0000 mg | ORAL_TABLET | Freq: Two times a day (BID) | ORAL | 0 refills | Status: DC

## 2018-09-05 NOTE — Telephone Encounter (Signed)
  Last written: 08/16/18 Last ov: 06/17/18 Next ov: none Contract: 09/24/18 UDS: 12/18/18

## 2018-09-05 NOTE — Telephone Encounter (Signed)
Refill request sent to you.  Please do for 90 days to [facility].

## 2018-09-08 NOTE — Telephone Encounter (Signed)
done

## 2018-09-09 ENCOUNTER — Telehealth: Payer: Self-pay

## 2018-09-09 NOTE — Telephone Encounter (Signed)
PA initiated via Covermymeds; KEY: [id number]. Awaiting determination.

## 2018-09-09 NOTE — Telephone Encounter (Signed)
Appeal letter faxed to [phone]. Awaiting appeal determination.

## 2018-09-09 NOTE — Telephone Encounter (Signed)
PA denied. Will appeal.  

## 2018-09-10 NOTE — Telephone Encounter (Signed)
Spoke w/ Abby M at [facility]- she needed to know what medications the Pt tried and failed- I informed that I placed all this information in the appeal letter that was faxed yesterday. Other questions answered, she informed she would forward this information to her appeal coordinator.

## 2018-09-11 ENCOUNTER — Encounter: Payer: Self-pay | Admitting: Family Medicine

## 2018-09-11 NOTE — Telephone Encounter (Signed)
Awaiting appeal determination.

## 2018-09-11 NOTE — Telephone Encounter (Signed)
Pt called in to follow up on refill request. Pt says that she is aware that a PA has to be completed. Advised that office is working on this. Pt says that she is completely out of her medication as of today.    Please advise.

## 2018-09-17 ENCOUNTER — Other Ambulatory Visit: Payer: Self-pay | Admitting: Family Medicine

## 2018-09-17 DIAGNOSIS — F988 Other specified behavioral and emotional disorders with onset usually occurring in childhood and adolescence: Secondary | ICD-10-CM

## 2018-09-17 MED ORDER — AMPHETAMINE-DEXTROAMPHETAMINE 15 MG PO TABS: 15.0000 mg | ORAL_TABLET | Freq: Two times a day (BID) | ORAL | 0 refills | Status: DC

## 2018-09-17 NOTE — Telephone Encounter (Signed)
done

## 2018-09-18 NOTE — Telephone Encounter (Signed)
Appeal denied. Forwarded information to [employer].

## 2018-09-18 NOTE — Telephone Encounter (Signed)
Sent message in [place]. See mychart

## 2018-09-25 ENCOUNTER — Encounter: Payer: Self-pay | Admitting: *Deleted

## 2018-09-25 NOTE — Telephone Encounter (Signed)
Appeal approved.  Case number [id number].  Approved for 12 months thru 09/24/19.

## 2018-10-13 ENCOUNTER — Encounter: Payer: Self-pay | Admitting: Family Medicine

## 2018-10-13 DIAGNOSIS — I10 Essential (primary) hypertension: Secondary | ICD-10-CM

## 2018-10-13 DIAGNOSIS — F909 Attention-deficit hyperactivity disorder, unspecified type: Secondary | ICD-10-CM

## 2018-10-14 MED ORDER — LOSARTAN POTASSIUM-HCTZ 50-12.5 MG PO TABS: 1.0000 | ORAL_TABLET | Freq: Every day | ORAL | 1 refills | Status: DC

## 2018-10-14 MED ORDER — HYDROCHLOROTHIAZIDE 12.5 MG PO CAPS: 12.5000 mg | ORAL_CAPSULE | Freq: Every day | ORAL | 1 refills | Status: DC

## 2019-02-28 ENCOUNTER — Other Ambulatory Visit: Payer: Self-pay | Admitting: Family Medicine

## 2019-02-28 DIAGNOSIS — I10 Essential (primary) hypertension: Secondary | ICD-10-CM

## 2019-02-28 DIAGNOSIS — F909 Attention-deficit hyperactivity disorder, unspecified type: Secondary | ICD-10-CM

## 2020-04-01 ENCOUNTER — Other Ambulatory Visit: Payer: Self-pay | Admitting: Internal Medicine

## 2020-04-01 DIAGNOSIS — M8580 Other specified disorders of bone density and structure, unspecified site: Secondary | ICD-10-CM

## 2020-04-01 DIAGNOSIS — Z1231 Encounter for screening mammogram for malignant neoplasm of breast: Secondary | ICD-10-CM

## 2020-04-01 DIAGNOSIS — E2839 Other primary ovarian failure: Secondary | ICD-10-CM

## 2020-04-27 ENCOUNTER — Other Ambulatory Visit: Payer: Self-pay

## 2020-04-27 ENCOUNTER — Ambulatory Visit
Admission: RE | Admit: 2020-04-27 | Discharge: 2020-04-27 | Disposition: A | Discharge status: Home / Self Care | Payer: Commercial Managed Care - PPO | Source: Ambulatory Visit | Attending: Internal Medicine | Admitting: Internal Medicine

## 2020-04-27 DIAGNOSIS — Z1231 Encounter for screening mammogram for malignant neoplasm of breast: Secondary | ICD-10-CM

## 2020-07-20 ENCOUNTER — Other Ambulatory Visit: Payer: Self-pay

## 2020-07-20 ENCOUNTER — Ambulatory Visit
Admission: RE | Admit: 2020-07-20 | Discharge: 2020-07-20 | Disposition: A | Discharge status: Home / Self Care | Payer: Commercial Managed Care - PPO | Source: Ambulatory Visit | Attending: Internal Medicine | Admitting: Internal Medicine

## 2020-07-20 DIAGNOSIS — E2839 Other primary ovarian failure: Secondary | ICD-10-CM

## 2020-07-20 DIAGNOSIS — M8580 Other specified disorders of bone density and structure, unspecified site: Secondary | ICD-10-CM

## 2020-08-18 ENCOUNTER — Ambulatory Visit: Payer: Commercial Managed Care - PPO | Admitting: Family Medicine

## 2020-08-18 ENCOUNTER — Ambulatory Visit: Payer: Self-pay

## 2020-08-18 ENCOUNTER — Other Ambulatory Visit: Payer: Self-pay

## 2020-08-18 VITALS — BP 136/76 | Ht 65.0 in | Wt 155.0 lb

## 2020-08-18 DIAGNOSIS — M76899 Other specified enthesopathies of unspecified lower limb, excluding foot: Secondary | ICD-10-CM | POA: Insufficient documentation

## 2020-08-18 DIAGNOSIS — S76311A Strain of muscle, fascia and tendon of the posterior muscle group at thigh level, right thigh, initial encounter: Secondary | ICD-10-CM

## 2020-08-18 MED ORDER — NITROGLYCERIN 0.2 MG/HR TD PT24: MEDICATED_PATCH | TRANSDERMAL | 11 refills | Status: DC

## 2020-08-18 NOTE — Assessment & Plan Note (Signed)
Has chronic changes at the proximal biceps femoris.  Appears to have a bursitis ongoing hospital. -Counseled on home exercise therapy and supportive care. -Nitro patches. -Can initiate shockwave therapy. -Could consider injection. -Signed physical therapy order.

## 2020-08-18 NOTE — Patient Instructions (Addendum)
Nice to meet you Please try the nitro patches and let me know if you have any problems.  Please try the exercises  We will let you know about the shockwave   Please send me a message in MyChart with any questions or updates.  Please see me back in 4 weeks.   --Dr. Jordan Likes  Nitroglycerin Protocol   Apply 1/4 nitroglycerin patch to affected area daily.  Change position of patch within the affected area every 24 hours.  You may experience a headache during the first 1-2 weeks of using the patch, these should subside.  If you experience headaches after beginning nitroglycerin patch treatment, you may take your preferred over the counter pain reliever.  Another side effect of the nitroglycerin patch is skin irritation or rash related to patch adhesive.  Please notify our office if you develop more severe headaches or rash, and stop the patch.  Tendon healing with nitroglycerin patch may require 12 to 24 weeks depending on the extent of injury.  Men should not use if taking Viagra, Cialis, or Levitra.   Do not use if you have migraines or rosacea.

## 2020-08-18 NOTE — Progress Notes (Signed)
Regina [name] - 55 y.o. female MRN [id number]  Date of birth: 15-Mar-1966  SUBJECTIVE:  Including CC & ROS.  No chief complaint on file.   Regina [name] is a 55 y.o. female that is presenting with acute on chronic bilateral hamstring pain.  Is occurring at the origin.  She has pain with sitting.  She has recent episodes of exacerbation.  Has been occurring for a period of time.  Does get improvement when she is working out.  No history of surgery.  Has taken ibuprofen and Advil.  Has been through physical therapy with limited improvement.   Review of Systems See HPI   HISTORY: Past Medical, Surgical, Social, and Family History Reviewed & Updated per EMR.   Pertinent Historical Findings include:  Past Medical History:  Diagnosis Date  . Allergy   . Anxiety   . Hypertension   . Migraines    Hormonal  . Osteoarthritis of neck   . Scoliosis     Past Surgical History:  Procedure Laterality Date  . Lyphoma      Family History  Problem Relation Age of Onset  . Hypertension Mother   . Diabetes Mother   . Emotional abuse Mother   . Alcohol abuse Mother   . Colon cancer Neg Hx   . Colon polyps Neg Hx   . Rectal cancer Neg Hx   . Stomach cancer Neg Hx   . Breast cancer Neg Hx     Social History   Socioeconomic History  . Marital status: Married    Spouse name: Not on file  . Number of children: Not on file  . Years of education: Not on file  . Highest education level: Not on file  Occupational History  . 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 Smoking status: Former Games developer  . Smokeless tobacco: Never Used  . Tobacco comment: quit 30 yrs ago   Substance and Sexual Activity  . Alcohol use: No    Comment: Rarely  . Drug use: No  . Sexual activity: Not Currently    Partners: Male  Other Topics Concern  . Not on file  Social History Narrative  . Not on file   Social Determinants of Health   Financial Resource Strain: Not on file  Food  Insecurity: Not on file  Transportation Needs: Not on file  Physical Activity: Not on file  Stress: Not on file  Social Connections: Not on file  Intimate Partner Violence: Not on file     PHYSICAL EXAM:  VS: BP 136/76 (BP Location: Left Arm, Patient Position: Sitting, Cuff Size: Normal)   Ht 5\' 5"  (1.651 m)   Wt 155 lb (70.3 kg)   LMP 04/29/2010   BMI 25.79 kg/m  Physical Exam Gen: NAD, alert, cooperative with exam, well-appearing MSK:  Right and left hamstring: Tenderness to palpation at the origin of the ischial tuberosity. No ecchymosis or swelling. Pain with resistance to leg extension at 90 degrees and worse at 130 degrees. Neurovascularly intact  Limited ultrasound: Left hamstring:  Normal-appearing mid belly of the hamstring. There is hyperemia at the proximal tendon as well as at the origin of the biceps femoris.  Irregularity of the ischial tuberosity to suggest chronic change. Anechoic change at the ischium to suggest bursitis.  Summary: Has chronic changes of the proximal hamstring as well as bursitis.  Ultrasound and interpretation by 05/01/2010, MD    ASSESSMENT & PLAN:   Hamstring tendinitis at origin Has  chronic changes at the proximal biceps femoris.  Appears to have a bursitis ongoing hospital. -Counseled on home exercise therapy and supportive care. -Nitro patches. -Can initiate shockwave therapy. -Could consider injection. -Signed physical therapy order.

## 2020-08-23 ENCOUNTER — Encounter: Payer: Self-pay | Admitting: Family Medicine

## 2020-09-13 ENCOUNTER — Other Ambulatory Visit: Payer: Self-pay

## 2020-09-13 ENCOUNTER — Ambulatory Visit: Payer: Self-pay | Admitting: Family Medicine

## 2020-09-13 DIAGNOSIS — M76899 Other specified enthesopathies of unspecified lower limb, excluding foot: Secondary | ICD-10-CM

## 2020-09-13 NOTE — Assessment & Plan Note (Signed)
First treatment of ECSWT today  - counseled on post treatment care - f/u in one week.

## 2020-09-13 NOTE — Progress Notes (Signed)
  Regina [name] - 55 y.o. female MRN [id number]  Date of birth: 07/25/1965  SUBJECTIVE:  Including CC & ROS.  No chief complaint on file.   Regina [name] is a 55 y.o. female that is  Here for first treatment of ECSWT.   Review of Systems See HPI   HISTORY: Past Medical, Surgical, Social, and Family History Reviewed & Updated per EMR.   Pertinent Historical Findings include:  Past Medical History:  Diagnosis Date  . Allergy   . Anxiety   . Hypertension   . Migraines    Hormonal  . Osteoarthritis of neck   . Scoliosis     Past Surgical History:  Procedure Laterality Date  . Lyphoma      Family History  Problem Relation Age of Onset  . Hypertension Mother   . Diabetes Mother   . Emotional abuse Mother   . Alcohol abuse Mother   . Colon cancer Neg Hx   . Colon polyps Neg Hx   . Rectal cancer Neg Hx   . Stomach cancer Neg Hx   . Breast cancer Neg Hx     Social History   Socioeconomic History  . Marital status: Married    Spouse name: Not on file  . Number of children: Not on file  . Years of education: Not on file  . Highest education level: Not on file  Occupational History  . 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 Smoking status: Former Games developer  . Smokeless tobacco: Never Used  . Tobacco comment: quit 30 yrs ago   Substance and Sexual Activity  . Alcohol use: No    Comment: Rarely  . Drug use: No  . Sexual activity: Not Currently    Partners: Male  Other Topics Concern  . Not on file  Social History Narrative  . Not on file   Social Determinants of Health   Financial Resource Strain: Not on file  Food Insecurity: Not on file  Transportation Needs: Not on file  Physical Activity: Not on file  Stress: Not on file  Social Connections: Not on file  Intimate Partner Violence: Not on file     PHYSICAL EXAM:  VS: Ht 5\' 5"  (1.651 m)   Wt 155 lb (70.3 kg)   LMP 04/29/2010   BMI 25.79 kg/m  Physical Exam Gen: NAD, alert,  cooperative with exam, well-appearing  Aspiration/Injection Procedure Note Regina [name] Oct 06, 1965  Procedure: ECSWT Indications: left hamstring pain  Procedure Details Consent: Risks of procedure as well as the alternatives and risks of each were explained to the (patient/caregiver).  Consent for procedure obtained. Time Out: Verified patient identification, verified procedure, site/side was marked, verified correct patient position, special equipment/implants available, medications/allergies/relevent history reviewed, required imaging and test results available.  Performed.  The area was cleaned with iodine and alcohol swabs.    The left proximal hamstring was targeted for Extracorporeal shockwave therapy.   Preset: patellar tendinopathy  Power Level: 60 Frequency: 10 Impulse/cycles: 2000 Head size: large    Patient did tolerate procedure well.   ASSESSMENT & PLAN:   Hamstring tendinitis at origin First treatment of ECSWT today  - counseled on post treatment care - f/u in one week.

## 2020-09-14 ENCOUNTER — Encounter: Payer: Self-pay | Admitting: Family Medicine

## 2020-09-15 ENCOUNTER — Ambulatory Visit: Payer: Commercial Managed Care - PPO | Admitting: Family Medicine

## 2020-09-22 ENCOUNTER — Other Ambulatory Visit: Payer: Self-pay

## 2020-09-22 ENCOUNTER — Ambulatory Visit: Payer: Self-pay | Admitting: Sports Medicine

## 2020-09-22 VITALS — Ht 65.0 in

## 2020-09-22 DIAGNOSIS — M76899 Other specified enthesopathies of unspecified lower limb, excluding foot: Secondary | ICD-10-CM

## 2020-09-22 NOTE — Progress Notes (Unsigned)
   Office Visit Note   Patient: Regina [name]           Date of Birth: 12-17-1965           MRN: [id number] Visit Date: 09/22/2020 Requested by: Zola Button, [place], [place] [address] [place],  [place] [address] PCP: Zola Button, Grayling Congress, DO  Subjective: CC: Proximal Hamstring Tendinopathy  HPI: 55yo F presenting to clinic for ESWT for her left proximal hamstring. Patient had her frist treatment last week which she tolerated very well.  She does states she was a little sore for several days after the treatment, but recovered very quickly.  She has been careful not to take NSAIDs or ice the area.  She is also in physical therapy, which she feels is helpful.  She states that, overall, she tolerated the last procedure very well.              ROS:   All other systems were reviewed and are negative.  Objective: Vital Signs: Ht 5\' 5"  (1.651 m)   LMP 04/29/2010   BMI 25.79 kg/m   Physical Exam:  General:  Alert and oriented, in no acute distress. Pulm:  Breathing unlabored. Psy:  Normal mood, congruent affect. Skin: Left posterior thigh with no bruising, rashes, or erythema.  Overlying skin intact. Tenderness to palpation over left ischial tuberosity and the proximal hamstring insertion site.  Imaging: No results found.  Assessment & Plan: 55 year old female presenting to clinic today for ESWT of her left proximal hamstring.  Procedure performed as described below, which patient tolerated very well.  Aftercare and return precautions discussed. -Return to clinic in 1 week for next treatment and series.     Procedures: Consent: Risks of procedure as well as the alternatives and risks of each were explained to the (patient/caregiver).  Consent for procedure obtained. Time Out: Verified patient identification, verified procedure, site/side was marked, verified correct patient position, special equipment/implants available, medications/allergies/relevent history  reviewed, required imaging and test results available.  Performed.  The area was cleaned with iodine and alcohol swabs.    The left proximal hamstring was targeted for Extracorporeal shockwave therapy.   Preset: patellar tendinopathy  Power Level: 100 Frequency: 16 Impulse/cycles: 2000 Head size: large   Patient tolerated treatment very well.  Patient seen and evaluated with the sports medicine fellow.  I agree with the above plan of care.  Second ECSWT treatment as above.  Follow-up in 1 week for next treatment.

## 2020-09-29 ENCOUNTER — Ambulatory Visit: Payer: Self-pay | Admitting: Family Medicine

## 2020-09-29 ENCOUNTER — Other Ambulatory Visit: Payer: Self-pay

## 2020-09-29 VITALS — Ht 65.0 in | Wt 155.0 lb

## 2020-09-29 DIAGNOSIS — M76899 Other specified enthesopathies of unspecified lower limb, excluding foot: Secondary | ICD-10-CM

## 2020-09-29 NOTE — Progress Notes (Signed)
   Office Visit Note   Patient: Regina [name]           Date of Birth: 1966-07-11           MRN: [id number] Visit Date: 09/29/2020 Requested by: Zola Button, [place], [place] [address] [place],  [place] [address] PCP: Zola Button, Grayling Congress, DO  Subjective: CC: Proximal Hamstring Tendinopathy  HPI: 55yo F presenting to clinic for 3rd ESWT for her left proximal hamstring. Patient states that she has thus far been tolerating her treatments very well.    She says that she feels as though she is slowly improving from her previous pain, except when doing dead lifts or Nordic exercises in the gym.  She has been diligent with her physical therapy exercises.                                                                      ROS:   All other systems were reviewed and are negative.   Objective: Vital Signs: Ht 5\' 5"  (1.651 m)   LMP 04/29/2010   BMI 25.79 kg/m   Physical Exam:  General:  Alert and oriented, in no acute distress. Pulm:  Breathing unlabored. Psy:  Normal mood, congruent affect. Skin: Left posterior thigh with no bruising, rashes, or erythema.  Overlying skin intact. Tenderness to palpation over left ischial tuberosity and the proximal hamstring insertion site.  Imaging: No results found.  Assessment & Plan: 55 year old female presenting to clinic today for  third ESWT of her left proximal hamstring.  Procedure performed as described below, which patient tolerated very well.  Aftercare and return precautions discussed. -Return to clinic in 1 week for next treatment and series (4/4).     Procedures: Consent:Risks of procedure as well as the alternatives and risks of each were explained to the (patient/caregiver). Consent for procedure obtained.  Theleft proximal hamstringwas targeted for Extracorporeal shockwave therapy.   Preset:patellar tendinopathy Power Level:120 Frequency:16 Impulse/cycles:2500 Head  size:large  Patient tolerated treatment very well.

## 2020-10-06 ENCOUNTER — Ambulatory Visit: Payer: Commercial Managed Care - PPO | Admitting: Family Medicine

## 2020-10-13 ENCOUNTER — Ambulatory Visit: Payer: Commercial Managed Care - PPO | Admitting: Family Medicine

## 2020-10-20 ENCOUNTER — Other Ambulatory Visit: Payer: Self-pay

## 2020-10-20 ENCOUNTER — Ambulatory Visit (INDEPENDENT_AMBULATORY_CARE_PROVIDER_SITE_OTHER): Payer: Self-pay | Admitting: Family Medicine

## 2020-10-20 VITALS — Ht 65.0 in

## 2020-10-20 DIAGNOSIS — M76899 Other specified enthesopathies of unspecified lower limb, excluding foot: Secondary | ICD-10-CM

## 2020-10-20 NOTE — Progress Notes (Signed)
   Office Visit Note   Patient: Regina [name]           Date of Birth: [birth year]           MRN: [id number] Visit Date: 10/20/2020 Requested by: Zola Button, [place], [place] [address] [place],  [place] [address] PCP: Zola Button, Grayling Congress, DO  Subjective: CC: Proximal Hamstring Tendinopathy  HPI: 56yo F presenting to clinic for 4th ESWT for her left proximal hamstring. Patient states that after her last treatment she did notice increasing soreness, though this has now resolved.   She says that she feels as though she is slowly improving, and she is happy to say that she was able to go on a 19 mile bike ride over the weekend with no recurrence of her pain.  She has been diligent with her physical therapy exercises.                                                                      ROS:   All other systems were reviewed and are negative.   Objective: Vital Signs: Ht 5\' 5"  (1.651 m)   LMP 04/29/2010   BMI 25.79 kg/m   Physical Exam:  General:  Alert and oriented, in no acute distress. Pulm:  Breathing unlabored. Psy:  Normal mood, congruent affect. Skin: Left posterior thigh with no bruising, rashes, or erythema.  Overlying skin intact. Tenderness to palpation over left ischial tuberosity and the proximal hamstring insertion site.  Imaging: No results found.  Assessment & Plan: 55 year old female presenting to clinic today for  fourth ESWT of her left proximal hamstring.  Procedure performed as described below, which patient tolerated very well.  Due to reported significant soreness after last treatment, intensity was dialed down from 120 to 110. Aftercare and return precautions discussed. -She has now completed 4/4 ESWT treatments.  In 3 to 4 weeks she is to return to clinic (either here or with her original provider) for ultrasound of the left proximal hamstring to help evaluate for sonographic improvement. -Patient expresses understanding.  She no  further questions or concerns today.     Procedures: Consent:Risks of procedure as well as the alternatives and risks of each were explained to the (patient/caregiver). Consent for procedure obtained.  Theleft proximal hamstringwas targeted for Extracorporeal shockwave therapy.   Preset:patellar tendinopathy Power Level:110 Frequency:16 Impulse/cycles:2500 Head size:large  Patient tolerated treatment very well.  I was the preceptor for this visit and available for immediate consultation 6/4, DO

## 2020-10-20 NOTE — Patient Instructions (Addendum)
You for coming to see [place] again! Continue your hamstring rehabilitation exercises.  In 3 to 4 weeks, he should come to clinic for an ultrasound of your hamstring to see if there has been any improvement in calcifications in this area.  You can either do this at the [facility] campus, or at the [facility] sports medicine center.  If you continue to notice improvement in your symptoms, and wish to undergo further treatments, we are happy to do this for you!

## 2020-11-14 ENCOUNTER — Encounter: Payer: Self-pay | Admitting: Family Medicine

## 2020-11-17 ENCOUNTER — Encounter: Payer: Self-pay | Admitting: Family Medicine

## 2020-11-17 ENCOUNTER — Other Ambulatory Visit: Payer: Self-pay

## 2020-11-17 ENCOUNTER — Ambulatory Visit (INDEPENDENT_AMBULATORY_CARE_PROVIDER_SITE_OTHER): Payer: Commercial Managed Care - PPO | Admitting: Family Medicine

## 2020-11-17 VITALS — BP 120/72 | Ht 65.0 in | Wt 155.0 lb

## 2020-11-17 DIAGNOSIS — M7752 Other enthesopathy of left foot: Secondary | ICD-10-CM | POA: Diagnosis not present

## 2020-11-17 DIAGNOSIS — M76899 Other specified enthesopathies of unspecified lower limb, excluding foot: Secondary | ICD-10-CM | POA: Diagnosis not present

## 2020-11-17 DIAGNOSIS — S92819G Other fracture of unspecified foot, subsequent encounter for fracture with delayed healing: Secondary | ICD-10-CM | POA: Insufficient documentation

## 2020-11-17 DIAGNOSIS — M19079 Primary osteoarthritis, unspecified ankle and foot: Secondary | ICD-10-CM | POA: Insufficient documentation

## 2020-11-17 NOTE — Assessment & Plan Note (Signed)
Has loss of the transverse arch and hammer toeing of the second digit.  Seems to have more of a capsulitis of the first MTP joint. -Counseled on adding a metatarsal pad to help with the transverse arch to get more mobility with the first MTP joint. -Could consider topical NSAIDs. -Could consider custom orthotics.

## 2020-11-17 NOTE — Progress Notes (Signed)
  SHYLYNN BRUNING - 55 y.o. female MRN [id number]  Date of birth: 12/29/1965  SUBJECTIVE:  Including CC & ROS.  No chief complaint on file.   LAURAINE CRESPO is a 54 y.o. female that is presenting with left great toe pain.  Pain occurs all plantar aspect of the great toe.  Does have some arch pain from time to time as well.   Review of Systems See HPI   HISTORY: Past Medical, Surgical, Social, and Family History Reviewed & Updated per EMR.   Pertinent Historical Findings include:  Past Medical History:  Diagnosis Date  . Allergy   . Anxiety   . Hypertension   . Migraines    Hormonal  . Osteoarthritis of neck   . Scoliosis     Past Surgical History:  Procedure Laterality Date  . Lyphoma      Family History  Problem Relation Age of Onset  . Hypertension Mother   . Diabetes Mother   . Emotional abuse Mother   . Alcohol abuse Mother   . Colon cancer Neg Hx   . Colon polyps Neg Hx   . Rectal cancer Neg Hx   . Stomach cancer Neg Hx   . Breast cancer Neg Hx     Social History   Socioeconomic History  . Marital status: Married    Spouse name: Not on file  . Number of children: Not on file  . Years of education: Not on file  . Highest education level: Not on file  Occupational History  . 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 Smoking status: Former Games developer  . Smokeless tobacco: Never Used  . Tobacco comment: quit 30 yrs ago   Substance and Sexual Activity  . Alcohol use: No    Comment: Rarely  . Drug use: No  . Sexual activity: Not Currently    Partners: Male  Other Topics Concern  . Not on file  Social History Narrative  . Not on file   Social Determinants of Health   Financial Resource Strain: Not on file  Food Insecurity: Not on file  Transportation Needs: Not on file  Physical Activity: Not on file  Stress: Not on file  Social Connections: Not on file  Intimate Partner Violence: Not on file     PHYSICAL EXAM:  VS: BP 120/72  (BP Location: Left Arm, Patient Position: Sitting, Cuff Size: Normal)   Ht 5\' 5"  (1.651 m)   Wt 155 lb (70.3 kg)   LMP 04/29/2010   BMI 25.79 kg/m  Physical Exam Gen: NAD, alert, cooperative with exam, well-appearing MSK:  Left foot: Hammer toeing of the second digit. Normal range of motion of the first MTP joint. Neurovascular clinic     ASSESSMENT & PLAN:   Hamstring tendinitis at origin Has gotten improvement with the shockwave therapy.  Is able to ride her bike and lift without pain. -Counseled on home exercise therapy and supportive care. -Could repeat shockwave at some point.  Capsulitis of toe of left foot Has loss of the transverse arch and hammer toeing of the second digit.  Seems to have more of a capsulitis of the first MTP joint. -Counseled on adding a metatarsal pad to help with the transverse arch to get more mobility with the first MTP joint. -Could consider topical NSAIDs. -Could consider custom orthotics.

## 2020-11-17 NOTE — Assessment & Plan Note (Signed)
Has gotten improvement with the shockwave therapy.  Is able to ride her bike and lift without pain. -Counseled on home exercise therapy and supportive care. -Could repeat shockwave at some point.

## 2021-05-05 ENCOUNTER — Other Ambulatory Visit (HOSPITAL_BASED_OUTPATIENT_CLINIC_OR_DEPARTMENT_OTHER): Payer: Self-pay

## 2021-05-05 MED ORDER — PROGESTERONE MICRONIZED 100 MG PO CAPS: ORAL_CAPSULE | ORAL | 1 refills | Status: DC

## 2021-05-05 MED ORDER — ESTRADIOL 1 MG PO TABS
ORAL_TABLET | ORAL | 1 refills | Status: DC
  Filled 2021-05-05: qty 90, 90d supply, fill #0

## 2021-05-05 MED ORDER — OLMESARTAN MEDOXOMIL-HCTZ 20-12.5 MG PO TABS
ORAL_TABLET | ORAL | 3 refills | Status: DC
  Filled 2021-08-15: qty 135, 90d supply, fill #0

## 2021-06-28 ENCOUNTER — Ambulatory Visit (INDEPENDENT_AMBULATORY_CARE_PROVIDER_SITE_OTHER): Payer: 59 | Admitting: Family Medicine

## 2021-06-28 ENCOUNTER — Encounter: Payer: Self-pay | Admitting: Family Medicine

## 2021-06-28 ENCOUNTER — Other Ambulatory Visit: Payer: Self-pay

## 2021-06-28 ENCOUNTER — Other Ambulatory Visit (HOSPITAL_BASED_OUTPATIENT_CLINIC_OR_DEPARTMENT_OTHER): Payer: Self-pay

## 2021-06-28 VITALS — BP 134/70 | HR 78 | Temp 97.7°F | Ht 65.0 in | Wt 147.8 lb

## 2021-06-28 DIAGNOSIS — Z79890 Hormone replacement therapy (postmenopausal): Secondary | ICD-10-CM | POA: Diagnosis not present

## 2021-06-28 MED ORDER — PROGESTERONE MICRONIZED 100 MG PO CAPS
200.0000 mg | ORAL_CAPSULE | Freq: Every day | ORAL | 3 refills | Status: DC
  Filled 2021-06-28: qty 10, 5d supply, fill #0
  Filled 2021-06-28: qty 170, 85d supply, fill #0
  Filled 2021-06-28: qty 180, 90d supply, fill #0
  Filled 2021-10-23: qty 180, 90d supply, fill #1
  Filled 2022-02-04: qty 180, 90d supply, fill #2
  Filled 2022-05-22: qty 110, 55d supply, fill #3
  Filled 2022-05-23: qty 70, 35d supply, fill #3

## 2021-06-28 NOTE — Progress Notes (Signed)
[id number] °

## 2021-06-28 NOTE — Progress Notes (Signed)
New Patient Office Visit  Subjective:  Patient ID: Regina [name], female    DOB: [birth year]  Age: 55 y.o. MRN: [id number]  CC:  Chief Complaint  Patient presents with   medication refilled    Pt states she just needs Progesterone refilled.    HPI YONEKO TALERICO presents for establishing care and med refill for HRT-will be seeing gyn but out of meds now as provider resh appt. Doing well on meds.  Bleeds if misses meds.  Just needs prometrium for now. Pap 2018.  Mamm 2021  Htn-bp's doing ok-will f/u w/PCP  ADD-adjusting meds.  Will see Dr. Beverely Low  Past Medical History:  Diagnosis Date   Hormone replacement therapy (HRT)    Hypertension    Migraines    Hormonal   Osteoarthritis of neck    Scoliosis     Past Surgical History:  Procedure Laterality Date   LIPOMA EXCISION Right 1999   shoulder    Family History  Problem Relation Age of Onset   Hypertension Mother    Diabetes Mother    Emotional abuse Mother    Alcohol abuse Mother    Colon cancer Neg Hx    Colon polyps Neg Hx    Rectal cancer Neg Hx    Stomach cancer Neg Hx    Breast cancer Neg Hx     Social History   Socioeconomic History   Marital status: Married    Spouse name: Not on file   Number of children: 2   Years of education: Not on file   Highest education level: Not on file  Occupational History   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  Tobacco Use   Smoking status: Former   Smokeless tobacco: Never   Tobacco comments:    quit 30 yrs ago   Substance and Sexual Activity   Alcohol use: Yes    Alcohol/week: 2.0 standard drinks    Types: 2 Glasses of wine per week    Comment: Rarely   Drug use: No   Sexual activity: Yes    Partners: Male    Birth control/protection: Post-menopausal  Other Topics Concern   Not on file  Social History Narrative   Not on file   Social Determinants of Health   Financial Resource Strain: Not on file  Food Insecurity: Not on file  Transportation  Needs: Not on file  Physical Activity: Not on file  Stress: Not on file  Social Connections: Not on file  Intimate Partner Violence: Not on file    ROS: negative/noncontributory except as in HPI  Objective:   Today's Vitals: BP 134/70    Pulse 78    Temp 97.7 F (36.5 C)    Ht 5\' 5"  (1.651 m)    Wt 147 lb 12.8 oz (67 kg)    LMP 04/29/2010    SpO2 96%    BMI 24.60 kg/m   Physical Exam Gen: WDWN NAD HEENT: NCAT, conjunctiva not injected, sclera nonicteric NECK:  supple, no thyromegaly, no nodes, no carotid bruits CARDIAC: RRR, S1S2+, no murmur. DP 2+B LUNGS: CTAB. No wheezes ABDOMEN:  BS+, soft, NTND, No HSM, no masses EXT:  no edema MSK: no gross abnormalities.  NEURO: A&O x3.  CN II-XII intact.  PSYCH: normal mood. Good eye contact   Assessment & Plan:   Problem List Items Addressed This Visit       Other   Hormone replacement therapy (HRT) - Primary   Relevant Medications   progesterone (PROMETRIUM) 100  MG capsule   HRT-doing well on meds.  Just needing appt to get refill until establishes w/new doc.  Prometrium sent  Outpatient Encounter Medications as of 06/28/2021  Medication Sig   Biotin 15726 MCG TABS Take 1 tablet by mouth daily.   estradiol (ESTRACE) 1 MG tablet Take 1 tablet by mouth once daily for menopause   Estradiol (VAGIFEM) 10 MCG TABS vaginal tablet Place 1 insert vaginally at night 3 times weekly   KRILL OIL 1000 MG CAPS Take 1 capsule by mouth daily.   Magnesium 500 MG CAPS Take 1 capsule by mouth daily.   olmesartan-hydrochlorothiazide (BENICAR HCT) 20-12.5 MG tablet Take 1 & 1/2 tablet by mouth once daily   TURMERIC PO Take 1,500 mg by mouth daily.   VITAMIN D, ERGOCALCIFEROL, PO Take 2,000 mg by mouth daily.    [DISCONTINUED] nitroGLYCERIN (NITRODUR - DOSED IN MG/24 HR) 0.2 mg/hr patch Cut and apply 1/4 patch to most painful area q24h.   [DISCONTINUED] progesterone (PROMETRIUM) 100 MG capsule Take 1 capsule by mouth daily for menopause    progesterone (PROMETRIUM) 100 MG capsule Take 2 capsules (200 mg total) by mouth daily.   [DISCONTINUED] amLODipine (NORVASC) 5 MG tablet Take 1 tablet (5 mg total) by mouth daily. Need office visit before any more refills   [DISCONTINUED] amphetamine-dextroamphetamine (ADDERALL) 15 MG tablet Take 1 tablet by mouth 2 (two) times daily.   [DISCONTINUED] amphetamine-dextroamphetamine (ADDERALL) 15 MG tablet Take 1 tablet by mouth 2 (two) times daily.   [DISCONTINUED] amphetamine-dextroamphetamine (ADDERALL) 15 MG tablet Take 1 tablet by mouth 2 (two) times daily.   [DISCONTINUED] BLACK COHOSH PO Take 540 mg by mouth daily.   [DISCONTINUED] hydrochlorothiazide (MICROZIDE) 12.5 MG capsule Take 1 capsule (12.5 mg total) by mouth daily.   [DISCONTINUED] losartan-hydrochlorothiazide (HYZAAR) 50-12.5 MG tablet Take 1 tablet by mouth daily. Need office visit before any more refills   [DISCONTINUED] Lutein-Zeaxanthin 15-0.7 MG CAPS Take by mouth.   [DISCONTINUED] minoxidil (ROGAINE) 2 % external solution Apply topically 2 (two) times daily.   [DISCONTINUED] vitamin A 20355 UNIT capsule Take 25,000 Units by mouth daily.   Facility-Administered Encounter Medications as of 06/28/2021  Medication   0.9 %  sodium chloride infusion    Follow-up: No follow-ups on file.   Angelena Sole., MD

## 2021-06-28 NOTE — Patient Instructions (Signed)
Welcome to [facility] at [facility]! It was a pleasure meeting you today.    PLEASE NOTE:  If you had any LAB tests please let us know if you have not heard back within a few days. You may see your results on MyChart before we have a chance to review them but we will give you a call once they are reviewed by us. If we ordered any REFERRALS today, please let us know if you have not heard from their office within the next week.  Let us know through MyChart if you are needing REFILLS, or have your pharmacy send us the request. You can also use MyChart to communicate with me or any office staff.  Please try these tips to maintain a healthy lifestyle:  Eat most of your calories during the day when you are active. Eliminate processed foods including packaged sweets (pies, cakes, cookies), reduce intake of potatoes, white bread, white pasta, and white rice. Look for whole grain options, oat flour or almond flour.  Each meal should contain half fruits/vegetables, one quarter protein, and one quarter carbs (no bigger than a computer mouse).  Cut down on sweet beverages. This includes juice, soda, and sweet tea. Also watch fruit intake, though this is a healthier sweet option, it still contains natural sugar! Limit to 3 servings daily.  Drink at least 1 glass of water with each meal and aim for at least 8 glasses per day  Exercise at least 150 minutes every week.   

## 2021-06-29 ENCOUNTER — Other Ambulatory Visit (HOSPITAL_BASED_OUTPATIENT_CLINIC_OR_DEPARTMENT_OTHER): Payer: Self-pay

## 2021-07-05 ENCOUNTER — Other Ambulatory Visit (HOSPITAL_BASED_OUTPATIENT_CLINIC_OR_DEPARTMENT_OTHER): Payer: Self-pay

## 2021-07-05 ENCOUNTER — Encounter: Payer: Self-pay | Admitting: Family Medicine

## 2021-07-05 ENCOUNTER — Ambulatory Visit: Payer: 59 | Admitting: Family Medicine

## 2021-07-05 VITALS — BP 110/82 | HR 68 | Temp 98.2°F | Resp 16 | Ht 64.5 in | Wt 145.6 lb

## 2021-07-05 DIAGNOSIS — I10 Essential (primary) hypertension: Secondary | ICD-10-CM

## 2021-07-05 DIAGNOSIS — F988 Other specified behavioral and emotional disorders with onset usually occurring in childhood and adolescence: Secondary | ICD-10-CM

## 2021-07-05 DIAGNOSIS — M8580 Other specified disorders of bone density and structure, unspecified site: Secondary | ICD-10-CM | POA: Diagnosis not present

## 2021-07-05 DIAGNOSIS — Z79890 Hormone replacement therapy (postmenopausal): Secondary | ICD-10-CM

## 2021-07-05 LAB — LIPID PANEL
Cholesterol: 191 mg/dL (ref 0–200)
HDL: 64.8 mg/dL (ref >39.00)
LDL Cholesterol: 93 mg/dL (ref 0–99)
NonHDL: 125.92
Total CHOL/HDL Ratio: 3
Triglycerides: 165 mg/dL — ABNORMAL HIGH (ref 0.0–149.0)
VLDL: 33 mg/dL (ref 0.0–40.0)

## 2021-07-05 LAB — CBC WITH DIFFERENTIAL/PLATELET
Basophils Absolute: 0 10*3/uL (ref 0.0–0.1)
Basophils Relative: 0.6 % (ref 0.0–3.0)
Eosinophils Absolute: 0 10*3/uL (ref 0.0–0.7)
Eosinophils Relative: 0.5 % (ref 0.0–5.0)
HCT: 38.1 % (ref 36.0–46.0)
Hemoglobin: 12.1 g/dL (ref 12.0–15.0)
Lymphocytes Relative: 37.2 % (ref 12.0–46.0)
Lymphs Abs: 1.9 10*3/uL (ref 0.7–4.0)
MCHC: 31.8 g/dL (ref 30.0–36.0)
MCV: 90.7 fl (ref 78.0–100.0)
Monocytes Absolute: 0.4 10*3/uL (ref 0.1–1.0)
Monocytes Relative: 7.8 % (ref 3.0–12.0)
Neutro Abs: 2.7 10*3/uL (ref 1.4–7.7)
Neutrophils Relative %: 53.9 % (ref 43.0–77.0)
Platelets: 298 10*3/uL (ref 150.0–400.0)
RBC: 4.2 Mil/uL (ref 3.87–5.11)
RDW: 13.8 % (ref 11.5–15.5)
WBC: 5 10*3/uL (ref 4.0–10.5)

## 2021-07-05 LAB — BASIC METABOLIC PANEL
BUN: 20 mg/dL (ref 6–23)
CO2: 28 mEq/L (ref 19–32)
Calcium: 9.4 mg/dL (ref 8.4–10.5)
Chloride: 100 mEq/L (ref 96–112)
Creatinine, Ser: 0.89 mg/dL (ref 0.40–1.20)
GFR: 72.23 mL/min (ref >60.00)
Glucose, Bld: 87 mg/dL (ref 70–99)
Potassium: 3.5 mEq/L (ref 3.5–5.1)
Sodium: 136 mEq/L (ref 135–145)

## 2021-07-05 LAB — TSH: TSH: 1.09 u[IU]/mL (ref 0.35–5.50)

## 2021-07-05 LAB — HEPATIC FUNCTION PANEL
ALT: 13 U/L (ref 0–35)
AST: 15 U/L (ref 0–37)
Albumin: 4.2 g/dL (ref 3.5–5.2)
Alkaline Phosphatase: 82 U/L (ref 39–117)
Bilirubin, Direct: 0.1 mg/dL (ref 0.0–0.3)
Total Bilirubin: 0.3 mg/dL (ref 0.2–1.2)
Total Protein: 7 g/dL (ref 6.0–8.3)

## 2021-07-05 LAB — VITAMIN D 25 HYDROXY (VIT D DEFICIENCY, FRACTURES): VITD: 34.97 ng/mL (ref 30.00–100.00)

## 2021-07-05 MED ORDER — AMPHETAMINE-DEXTROAMPHET ER 20 MG PO CP24
20.0000 mg | ORAL_CAPSULE | Freq: Every day | ORAL | 0 refills | Status: DC
  Filled 2021-07-05: qty 90, 90d supply, fill #0

## 2021-07-05 MED ORDER — ESTRADIOL 1 MG PO TABS
ORAL_TABLET | ORAL | 1 refills | Status: DC
Start: 2021-07-05 — End: 2022-01-24
  Filled 2021-07-05: qty 105, 90d supply, fill #0
  Filled 2021-07-20: qty 105, 84d supply, fill #0
  Filled 2021-10-23: qty 105, 84d supply, fill #1

## 2021-07-05 NOTE — Patient Instructions (Signed)
Schedule your complete physical w/ pap in 6 months We'll notify you of your lab results and make any changes if needed Continue to work on healthy diet and regular exercise- you look great! Call and schedule your mammogram at your convenience Call with any questions or concerns Stay Safe!  Stay Healthy! Welcome!  We're glad to have you! Happy Holidays!!

## 2021-07-05 NOTE — Assessment & Plan Note (Signed)
Ongoing for pt.  Is aware of increased risk of breast cancer.  Pt is to schedule mammo.  Refill provided on Estradiol.  Pt got Progesterone last week.

## 2021-07-05 NOTE — Progress Notes (Signed)
° °  Subjective:    Patient ID: Regina [name], female    DOB: Jan 15, 1966, 55 y.o.   MRN: [id number]  HPI Here to establish.  Previous PCP- [facility]  HTN- chronic problem, on Olmesartan HCTZ 20/12.5mg  daily- takes 1.5 tabs daily w/ good control.  No CP, SOB, HAs, visual changes, edema.  Exercising daily.  ADHD- chronic problem, on Adderall XR 20mg  daily and Adderall 10mg  for additional symptom control in the afternoon.  Pt is inattentive rather than hyperactive.  Denies palpitations.  No insomnia.  Denies anxiety.  HRT- pt has had 2 gyn appts rescheduled.  Uses 1mg  Estradiol 4 days and then 1.5 tabs 3 days/week.  Is due for upcoming pap.  Due for mammogram.   Review of Systems For ROS see HPI   This visit occurred during the SARS-CoV-2 public health emergency.  Safety protocols were in place, including screening questions prior to the visit, additional usage of staff PPE, and extensive cleaning of exam room while observing appropriate contact time as indicated for disinfecting solutions.      Objective:   Physical Exam Vitals reviewed.  Constitutional:      General: She is not in acute distress.    Appearance: Normal appearance. She is well-developed. She is not ill-appearing.  HENT:     Head: Normocephalic and atraumatic.  Eyes:     Conjunctiva/sclera: Conjunctivae normal.     Pupils: Pupils are equal, round, and reactive to light.  Neck:     Thyroid: No thyromegaly.  Cardiovascular:     Rate and Rhythm: Normal rate and regular rhythm.     Pulses: Normal pulses.     Heart sounds: Normal heart sounds. No murmur heard. Pulmonary:     Effort: Pulmonary effort is normal. No respiratory distress.     Breath sounds: Normal breath sounds.  Abdominal:     General: There is no distension.     Palpations: Abdomen is soft.     Tenderness: There is no abdominal tenderness.  Musculoskeletal:     Cervical back: Normal range of motion and neck supple.  Lymphadenopathy:     Cervical: No  cervical adenopathy.  Skin:    General: Skin is warm and dry.  Neurological:     Mental Status: She is alert and oriented to person, place, and time.  Psychiatric:        Behavior: Behavior normal.          Assessment & Plan:

## 2021-07-05 NOTE — Assessment & Plan Note (Signed)
New to provider, ongoing for pt.  Currently well controlled and asymptomatic.  Check labs due to ARB and diuretic.  No anticipated med changes.  Will follow.

## 2021-07-05 NOTE — Assessment & Plan Note (Signed)
Chronic problem.  UTD on DEXA.  Currently on Vit D supplement.

## 2021-07-05 NOTE — Assessment & Plan Note (Signed)
New to provider, ongoing for pt.  On Adderall XR 20mg  daily w/ 10mg  as needed for symptomatic relief.  Refill provided.

## 2021-07-20 ENCOUNTER — Other Ambulatory Visit (HOSPITAL_BASED_OUTPATIENT_CLINIC_OR_DEPARTMENT_OTHER): Payer: Self-pay

## 2021-08-15 ENCOUNTER — Other Ambulatory Visit: Payer: Self-pay

## 2021-08-15 ENCOUNTER — Ambulatory Visit (INDEPENDENT_AMBULATORY_CARE_PROVIDER_SITE_OTHER): Payer: 59 | Admitting: Family Medicine

## 2021-08-15 ENCOUNTER — Ambulatory Visit: Payer: Self-pay

## 2021-08-15 ENCOUNTER — Encounter: Payer: Self-pay | Admitting: Family Medicine

## 2021-08-15 ENCOUNTER — Ambulatory Visit (HOSPITAL_BASED_OUTPATIENT_CLINIC_OR_DEPARTMENT_OTHER)
Admission: RE | Admit: 2021-08-15 | Discharge: 2021-08-15 | Disposition: A | Discharge status: Home / Self Care | Payer: 59 | Source: Ambulatory Visit | Attending: Family Medicine | Admitting: Family Medicine

## 2021-08-15 ENCOUNTER — Other Ambulatory Visit (HOSPITAL_BASED_OUTPATIENT_CLINIC_OR_DEPARTMENT_OTHER): Payer: Self-pay

## 2021-08-15 VITALS — BP 136/84 | Ht 64.5 in | Wt 145.0 lb

## 2021-08-15 DIAGNOSIS — S92812G Other fracture of left foot, subsequent encounter for fracture with delayed healing: Secondary | ICD-10-CM

## 2021-08-15 DIAGNOSIS — M23301 Other meniscus derangements, unspecified lateral meniscus, left knee: Secondary | ICD-10-CM

## 2021-08-15 DIAGNOSIS — S83412A Sprain of medial collateral ligament of left knee, initial encounter: Secondary | ICD-10-CM

## 2021-08-15 DIAGNOSIS — M25562 Pain in left knee: Secondary | ICD-10-CM

## 2021-08-15 NOTE — Patient Instructions (Signed)
Good to see you Please try ice as needed Please try the brace  I will call with the xray results.  The MRI will be at the [place] location   Please send me a message in [place] with any questions or updates.  We'll schedule a virtual visit once the MRI is resulted.   --Dr. Raeford Razor

## 2021-08-16 ENCOUNTER — Telehealth: Payer: Self-pay | Admitting: Family Medicine

## 2021-08-16 ENCOUNTER — Other Ambulatory Visit (HOSPITAL_BASED_OUTPATIENT_CLINIC_OR_DEPARTMENT_OTHER): Payer: Self-pay

## 2021-08-16 DIAGNOSIS — S83412A Sprain of medial collateral ligament of left knee, initial encounter: Secondary | ICD-10-CM | POA: Insufficient documentation

## 2021-08-16 DIAGNOSIS — M23301 Other meniscus derangements, unspecified lateral meniscus, left knee: Secondary | ICD-10-CM | POA: Insufficient documentation

## 2021-08-16 NOTE — Assessment & Plan Note (Signed)
Acute on chronic in nature.  Has pain on the plantar aspect of the first MTP joint.  Concern for sesamoiditis versus ongoing stress fracture.  She has completed greater than 6 weeks of home exercise therapy as well as cushioning and insoles. -Counseled on home exercise therapy and supportive care. -X-ray. -MRI of the left foot to evaluate for stress fracture of the sesamoid versus sesamoiditis.

## 2021-08-16 NOTE — Assessment & Plan Note (Signed)
Acutely occurring.  Has a mild sprain. -Counseled on home exercise therapy and supportive care. -Hinged knee brace. -Could consider physical therapy.

## 2021-08-16 NOTE — Progress Notes (Signed)
°  Regina [name] - 56 y.o. female MRN [id number]  Date of birth: 1965/09/04  SUBJECTIVE:  Including CC & ROS.  No chief complaint on file.   ALITHEA [name] is a 56 y.o. female that is presenting with acute left knee pain and presenting with acute on chronic left great toe pain.  She was playing pickle ball and was moving to her left and felt her knee give way.  Since that time she has had pain along the joint space.  The toe pain is occurring on the plantar aspect of the first MTP joint.  Has been present since May 2022.  She has undergone home exercise therapy as well as cushioning to the area..   Review of Systems See HPI   HISTORY: Past Medical, Surgical, Social, and Family History Reviewed & Updated per EMR.   Pertinent Historical Findings include:  Past Medical History:  Diagnosis Date   Hormone replacement therapy (HRT)    Hypertension    Migraines    Hormonal   Osteoarthritis of neck    Scoliosis     Past Surgical History:  Procedure Laterality Date   LIPOMA EXCISION Right 1999   shoulder     PHYSICAL EXAM:  VS: BP 136/84 (BP Location: Left Arm, Patient Position: Sitting)    Ht 5' 4.5" (1.638 m)    Wt 145 lb (65.8 kg)    LMP 04/29/2010    BMI 24.50 kg/m  Physical Exam Gen: NAD, alert, cooperative with exam, well-appearing MSK:  Neurovascularly intact    Limited ultrasound: Left knee:  No effusion suprapatellar pouch. Mild degenerative changes of the medial meniscus. Hyperemia at the lateral meniscus. Small Baker's cyst. Swelling and hypoechoic change through the mid substance of the MCL.  Summary: MCL sprain and degenerative meniscal changes  Ultrasound and interpretation by Clare Gandy, MD    ASSESSMENT & PLAN:   Closed fracture of sesamoid bone of foot with delayed healing Acute on chronic in nature.  Has pain on the plantar aspect of the first MTP joint.  Concern for sesamoiditis versus ongoing stress fracture.  She has completed greater  than 6 weeks of home exercise therapy as well as cushioning and insoles. -Counseled on home exercise therapy and supportive care. -X-ray. -MRI of the left foot to evaluate for stress fracture of the sesamoid versus sesamoiditis.  Sprain of medial collateral ligament of left knee Acutely occurring.  Has a mild sprain. -Counseled on home exercise therapy and supportive care. -Hinged knee brace. -Could consider physical therapy.  Degenerative tear of lateral meniscus of left knee Has irritation occurring at the lateral meniscus.  3 she is experiencing most of her pain. -Counseled on home exercise therapy and supportive care. -Could consider further imaging, physical therapy or PRP.

## 2021-08-16 NOTE — Telephone Encounter (Signed)
Informed of results.   Myra Rude, MD [facility] 08/16/2021, 5:19 PM

## 2021-08-16 NOTE — Assessment & Plan Note (Signed)
Has irritation occurring at the lateral meniscus.  3 she is experiencing most of her pain. -Counseled on home exercise therapy and supportive care. -Could consider further imaging, physical therapy or PRP.

## 2021-08-24 ENCOUNTER — Ambulatory Visit (HOSPITAL_COMMUNITY)
Admission: RE | Admit: 2021-08-24 | Discharge: 2021-08-24 | Disposition: A | Discharge status: Home / Self Care | Payer: 59 | Source: Ambulatory Visit | Attending: Family Medicine | Admitting: Family Medicine

## 2021-08-24 DIAGNOSIS — S92812G Other fracture of left foot, subsequent encounter for fracture with delayed healing: Secondary | ICD-10-CM | POA: Diagnosis not present

## 2021-09-01 ENCOUNTER — Telehealth (INDEPENDENT_AMBULATORY_CARE_PROVIDER_SITE_OTHER): Payer: 59 | Admitting: Family Medicine

## 2021-09-01 ENCOUNTER — Encounter: Payer: Self-pay | Admitting: Family Medicine

## 2021-09-01 DIAGNOSIS — M19079 Primary osteoarthritis, unspecified ankle and foot: Secondary | ICD-10-CM | POA: Diagnosis not present

## 2021-09-01 DIAGNOSIS — S83412D Sprain of medial collateral ligament of left knee, subsequent encounter: Secondary | ICD-10-CM

## 2021-09-01 NOTE — Progress Notes (Signed)
Virtual Visit via Video Note  I connected with Regina [name] on 09/01/21 at  8:20 AM EST by a video enabled telemedicine application and verified that I am speaking with the correct person using two identifiers.  Location: Patient: work Provider: office   I discussed the limitations of evaluation and management by telemedicine and the availability of in person appointments. The patient expressed understanding and agreed to proceed.  History of Present Illness:  Regina [name] is following up after her left MRi of her foot. This was demonstrating MTP arthritis with effusion and small subchondral edema in the first MT head.    Observations/Objective:   Assessment and Plan:  Arthritis of first MTP joint of the left foot: MRI was revealing for arthritis in the first MTP joint with effusion. -Counseled on home exercise therapy and supportive care. -Could consider injection of first ray post  MCL sprain of left knee: Having pain intermittently with twisting motion of the knee. -Counseled on home exercise therapy and supportive care. -Could consider physical therapy or nitro patches  Follow Up Instructions:    I discussed the assessment and treatment plan with the patient. The patient was provided an opportunity to ask questions and all were answered. The patient agreed with the plan and demonstrated an understanding of the instructions.   The patient was advised to call back or seek an in-person evaluation if the symptoms worsen or if the condition fails to improve as anticipated.    Clare Gandy, MD

## 2021-09-01 NOTE — Assessment & Plan Note (Signed)
Having pain intermittently with twisting motion of the knee. -Counseled on home exercise therapy and supportive care. -Could consider physical therapy or nitro patches.

## 2021-09-01 NOTE — Assessment & Plan Note (Signed)
MRI was revealing for arthritis in the first MTP joint with effusion. -Counseled on home exercise therapy and supportive care. -Could consider injection of first ray post

## 2021-09-07 ENCOUNTER — Encounter: Payer: Self-pay | Admitting: Family Medicine

## 2021-09-11 ENCOUNTER — Other Ambulatory Visit (HOSPITAL_BASED_OUTPATIENT_CLINIC_OR_DEPARTMENT_OTHER): Payer: Self-pay

## 2021-10-08 ENCOUNTER — Other Ambulatory Visit: Payer: Self-pay | Admitting: Family Medicine

## 2021-10-09 ENCOUNTER — Other Ambulatory Visit (HOSPITAL_BASED_OUTPATIENT_CLINIC_OR_DEPARTMENT_OTHER): Payer: Self-pay

## 2021-10-09 MED ORDER — AMPHETAMINE-DEXTROAMPHET ER 20 MG PO CP24
20.0000 mg | ORAL_CAPSULE | Freq: Every day | ORAL | 0 refills | Status: DC
  Filled 2021-10-09: qty 90, 90d supply, fill #0

## 2021-10-10 ENCOUNTER — Other Ambulatory Visit (HOSPITAL_BASED_OUTPATIENT_CLINIC_OR_DEPARTMENT_OTHER): Payer: Self-pay

## 2021-10-23 ENCOUNTER — Other Ambulatory Visit (HOSPITAL_BASED_OUTPATIENT_CLINIC_OR_DEPARTMENT_OTHER): Payer: Self-pay

## 2021-11-19 ENCOUNTER — Encounter: Payer: Self-pay | Admitting: Family Medicine

## 2021-11-21 ENCOUNTER — Other Ambulatory Visit (HOSPITAL_BASED_OUTPATIENT_CLINIC_OR_DEPARTMENT_OTHER): Payer: Self-pay

## 2021-11-21 MED ORDER — OLMESARTAN MEDOXOMIL 20 MG PO TABS
30.0000 mg | ORAL_TABLET | Freq: Every day | ORAL | 1 refills | Status: DC
Start: 2021-11-21 — End: 2022-06-05
  Filled 2021-11-21: qty 135, 90d supply, fill #0
  Filled 2022-02-26: qty 135, 90d supply, fill #1

## 2021-11-21 MED ORDER — HYDROCHLOROTHIAZIDE 12.5 MG PO TABS
12.5000 mg | ORAL_TABLET | Freq: Every day | ORAL | 1 refills | Status: DC
Start: 2021-11-21 — End: 2022-08-29
  Filled 2021-11-21: qty 90, 90d supply, fill #0
  Filled 2022-08-28: qty 90, 90d supply, fill #1

## 2021-12-05 ENCOUNTER — Ambulatory Visit: Payer: Self-pay

## 2021-12-05 ENCOUNTER — Encounter: Payer: Self-pay | Admitting: Family Medicine

## 2021-12-05 ENCOUNTER — Ambulatory Visit (INDEPENDENT_AMBULATORY_CARE_PROVIDER_SITE_OTHER): Payer: 59 | Admitting: Family Medicine

## 2021-12-05 ENCOUNTER — Ambulatory Visit (HOSPITAL_BASED_OUTPATIENT_CLINIC_OR_DEPARTMENT_OTHER)
Admission: RE | Admit: 2021-12-05 | Discharge: 2021-12-05 | Disposition: A | Discharge status: Home / Self Care | Payer: 59 | Source: Ambulatory Visit | Attending: Family Medicine | Admitting: Family Medicine

## 2021-12-05 VITALS — BP 118/70 | Ht 64.5 in | Wt 145.0 lb

## 2021-12-05 DIAGNOSIS — M25562 Pain in left knee: Secondary | ICD-10-CM

## 2021-12-05 DIAGNOSIS — M84364A Stress fracture, left fibula, initial encounter for fracture: Secondary | ICD-10-CM | POA: Insufficient documentation

## 2021-12-05 DIAGNOSIS — S83412D Sprain of medial collateral ligament of left knee, subsequent encounter: Secondary | ICD-10-CM | POA: Diagnosis present

## 2021-12-05 NOTE — Assessment & Plan Note (Signed)
Acutely occurring.  Concern for possible stress fracture given the pain in the area. -Counseled on home exercise therapy and supportive care. -MRI of the left knee to involve the proximal portion of the fibula.

## 2021-12-05 NOTE — Patient Instructions (Signed)
Good to see you Please try compression  You can continue nitro patches  I will call with the x-ray results from today. We will get the MRI at [facility] imaging. Please send me a message in MyChart with any questions or updates.  We will set up a virtual visit to discuss the MRI results..   --Dr. Jordan Likes

## 2021-12-05 NOTE — Progress Notes (Signed)
  Regina [name] - 56 y.o. female MRN [id number]  Date of birth: 06-Apr-1966  SUBJECTIVE:  Including CC & ROS.  No chief complaint on file.   Regina [name] is a 56 y.o. female that is presenting with worsening of her left knee pain as well as having pain over the proximal third of the lower leg.  She had a knee injury a few months ago.  Still having pain over the lateral aspect.  Also having pain in the proximal third of the lower leg.   Review of Systems See HPI   HISTORY: Past Medical, Surgical, Social, and Family History Reviewed & Updated per EMR.   Pertinent Historical Findings include:  Past Medical History:  Diagnosis Date   Hormone replacement therapy (HRT)    Hypertension    Migraines    Hormonal   Osteoarthritis of neck    Scoliosis     Past Surgical History:  Procedure Laterality Date   LIPOMA EXCISION Right 1999   shoulder     PHYSICAL EXAM:  VS: BP 118/70 (BP Location: Left Arm, Patient Position: Sitting)   Ht 5' 4.5" (1.638 m)   Wt 145 lb (65.8 kg)   LMP 04/29/2010   BMI 24.50 kg/m  Physical Exam Gen: NAD, alert, cooperative with exam, well-appearing MSK:  Left knee: Limited range of motion. Tenderness over the lateral joint space. Instability with valgus and varus stress testing. Positive McMurray's test Neurovascularly intact    Limited ultrasound: Left knee and the lower leg:  The right no joint effusion in the suprapatellar pouch. Normal-appearing quadricep and patellar tendon. No changes of the meniscus in the medial or lateral joint space. There is increased hyperemia within the surrounding tissue of the proximal fibular shaft. No cortical change of the fibular shaft  Summary: Nonspecific hyperemia associated around the proximal fibular shaft  Ultrasound and interpretation by Clare Gandy, MD    ASSESSMENT & PLAN:   Sprain of medial collateral ligament of left knee Acutely occurring.  Had an injury a few months ago.   Still having pain over the medial aspect and does have degenerative meniscal changes appreciated.  We have tried bracing as well as greater than 6 weeks of home with physician directed exercise therapy. -Counseled on home exercise therapy and supportive care. -Counseled on bracing. -X-ray. -MRI of the left knee to evaluate for meniscal tear and for consideration of presurgical planning.  Stress fracture of left fibula Acutely occurring.  Concern for possible stress fracture given the pain in the area. -Counseled on home exercise therapy and supportive care. -MRI of the left knee to involve the proximal portion of the fibula.

## 2021-12-05 NOTE — Assessment & Plan Note (Signed)
Acutely occurring.  Had an injury a few months ago.  Still having pain over the medial aspect and does have degenerative meniscal changes appreciated.  We have tried bracing as well as greater than 6 weeks of home with physician directed exercise therapy. -Counseled on home exercise therapy and supportive care. -Counseled on bracing. -X-ray. -MRI of the left knee to evaluate for meniscal tear and for consideration of presurgical planning.

## 2021-12-07 ENCOUNTER — Telehealth: Payer: Self-pay | Admitting: Family Medicine

## 2021-12-07 NOTE — Telephone Encounter (Signed)
Informed of results.   Rosemarie Ax, MD [facility] 12/07/2021, 12:45 PM

## 2021-12-22 ENCOUNTER — Other Ambulatory Visit (HOSPITAL_BASED_OUTPATIENT_CLINIC_OR_DEPARTMENT_OTHER): Payer: Self-pay

## 2021-12-22 ENCOUNTER — Other Ambulatory Visit: Payer: Self-pay | Admitting: Family Medicine

## 2021-12-22 MED ORDER — AMPHETAMINE-DEXTROAMPHETAMINE 10 MG PO TABS
10.0000 mg | ORAL_TABLET | Freq: Every day | ORAL | 0 refills | Status: DC
  Filled 2021-12-22: qty 90, 90d supply, fill #0

## 2021-12-22 NOTE — Telephone Encounter (Signed)
Patient is requesting a refill of the following medications: Requested Prescriptions   Pending Prescriptions Disp Refills   amphetamine-dextroamphetamine (ADDERALL) 10 MG tablet      Sig: Take 1 tablet (10 mg total) by mouth daily with breakfast.    Date of patient request: 12/22/21 Last office visit: 06/15/21 Date of last refill: 10/09/21 Last refill amount:90

## 2022-01-01 ENCOUNTER — Other Ambulatory Visit (HOSPITAL_BASED_OUTPATIENT_CLINIC_OR_DEPARTMENT_OTHER): Payer: Self-pay

## 2022-01-03 ENCOUNTER — Encounter: Payer: Self-pay | Admitting: Family Medicine

## 2022-01-03 ENCOUNTER — Other Ambulatory Visit (HOSPITAL_BASED_OUTPATIENT_CLINIC_OR_DEPARTMENT_OTHER): Payer: Self-pay

## 2022-01-03 ENCOUNTER — Other Ambulatory Visit (HOSPITAL_COMMUNITY)
Admission: RE | Admit: 2022-01-03 | Discharge: 2022-01-03 | Disposition: A | Discharge status: Home / Self Care | Payer: 59 | Source: Ambulatory Visit | Attending: Family Medicine | Admitting: Family Medicine

## 2022-01-03 ENCOUNTER — Ambulatory Visit (INDEPENDENT_AMBULATORY_CARE_PROVIDER_SITE_OTHER): Payer: 59 | Admitting: Family Medicine

## 2022-01-03 ENCOUNTER — Other Ambulatory Visit: Payer: Self-pay

## 2022-01-03 VITALS — BP 114/68 | HR 69 | Temp 97.8°F | Resp 18 | Ht 64.5 in | Wt 150.4 lb

## 2022-01-03 DIAGNOSIS — Z23 Encounter for immunization: Secondary | ICD-10-CM | POA: Diagnosis not present

## 2022-01-03 DIAGNOSIS — Z1231 Encounter for screening mammogram for malignant neoplasm of breast: Secondary | ICD-10-CM

## 2022-01-03 DIAGNOSIS — Z0000 Encounter for general adult medical examination without abnormal findings: Secondary | ICD-10-CM

## 2022-01-03 DIAGNOSIS — Z124 Encounter for screening for malignant neoplasm of cervix: Secondary | ICD-10-CM

## 2022-01-03 MED ORDER — AMPHETAMINE-DEXTROAMPHETAMINE 10 MG PO TABS: ORAL_TABLET | ORAL | 0 refills | Status: DC

## 2022-01-03 MED ORDER — AMPHETAMINE-DEXTROAMPHET ER 20 MG PO CP24
20.0000 mg | ORAL_CAPSULE | Freq: Every day | ORAL | 0 refills | Status: DC
  Filled 2022-01-03 – 2022-01-19 (×3): qty 30, 30d supply, fill #0

## 2022-01-03 NOTE — Assessment & Plan Note (Addendum)
Pt's PE WNL.  UTD on DEXA, colonoscopy.  Due for repeat mammo.  Tdap updated.  Reviewed labs done at [facility] for [facility] Day- look good!  No changes at this time.  Anticipatory guidance provided.

## 2022-01-03 NOTE — Progress Notes (Signed)
   Subjective:    Patient ID: Regina [name], female    DOB: 1966-05-05, 56 y.o.   MRN: [id number]  HPI CPE- UTD on colonoscopy, mammo.  Due for pap and Tdap  Health Maintenance  Topic Date Due   Hepatitis C Screening  Never done   TETANUS/TDAP  01/28/2020   PAP SMEAR-Modifier  06/10/2020   COVID-19 Vaccine (5 - Pfizer series) 01/26/2021   HIV Screening  01/04/2023 (Originally 08/12/1979)   INFLUENZA VACCINE  02/13/2022   MAMMOGRAM  04/27/2022   COLONOSCOPY (Pts 45-37yrs Insurance coverage will need to be confirmed)  04/12/2026   Zoster Vaccines- Shingrix  Completed   HPV VACCINES  Aged Out      Review of Systems Patient reports no vision/ hearing changes, adenopathy,fever, weight change,  persistant/recurrent hoarseness , swallowing issues, chest pain, palpitations, edema, persistant/recurrent cough, hemoptysis, dyspnea (rest/exertional/paroxysmal nocturnal), gastrointestinal bleeding (melena, rectal bleeding), abdominal pain, significant heartburn, bowel changes, GU symptoms (dysuria, hematuria, incontinence), Gyn symptoms (abnormal  bleeding, pain),  syncope, focal weakness, memory loss, numbness & tingling, skin/hair/nail changes, abnormal bruising or bleeding, anxiety, or depression.     Objective:   Physical Exam  General Appearance:    Alert, cooperative, no distress, appears stated age  Head:    Normocephalic, without obvious abnormality, atraumatic  Eyes:    PERRL, conjunctiva/corneas clear, EOM's intact both eyes  Ears:    Normal TM's and external ear canals, both ears  Nose:   Nares normal, septum midline, mucosa normal, no drainage    or sinus tenderness  Throat:   Lips, mucosa, and tongue normal; teeth and gums normal  Neck:   Supple, symmetrical, trachea midline, no adenopathy;    Thyroid: no enlargement/tenderness/nodules  Back:     Symmetric, no curvature, ROM normal, no CVA tenderness  Lungs:     Clear to auscultation bilaterally, respirations unlabored   Chest Wall:    No tenderness or deformity   Heart:    Regular rate and rhythm, S1 and S2 normal, no murmur, rub   or gallop  Breast Exam:    Deferred to mammo  Abdomen:     Soft, non-tender, bowel sounds active all four quadrants,    no masses, no organomegaly  Genitalia:    External genitalia normal, cervix normal in appearance, no CMT, uterus in normal size and position, adnexa w/out mass or tenderness, mucosa pink and moist, no lesions or discharge present  Rectal:    Normal external appearance  Extremities:   Extremities normal, atraumatic, no cyanosis or edema  Pulses:   2+ and symmetric all extremities  Skin:   Skin color, texture, turgor normal, no rashes or lesions  Lymph nodes:   Cervical, supraclavicular, and axillary nodes normal  Neurologic:   CNII-XII intact, normal strength, sensation and reflexes    throughout          Assessment & Plan:

## 2022-01-03 NOTE — Patient Instructions (Addendum)
Follow up in 6 months to recheck BP We'll notify you of your lab results and make any changes if needed Keep up the good work on healthy diet and regular exercise- you look great!! Schedule your mammogram at your convenience Call with any questions or concerns Have a great summer!!!

## 2022-01-05 ENCOUNTER — Telehealth: Payer: Self-pay

## 2022-01-05 ENCOUNTER — Other Ambulatory Visit (HOSPITAL_BASED_OUTPATIENT_CLINIC_OR_DEPARTMENT_OTHER): Payer: Self-pay

## 2022-01-05 LAB — CYTOLOGY - PAP
Comment: NEGATIVE
Diagnosis: NEGATIVE
High risk HPV: NEGATIVE

## 2022-01-18 ENCOUNTER — Other Ambulatory Visit (HOSPITAL_BASED_OUTPATIENT_CLINIC_OR_DEPARTMENT_OTHER): Payer: Self-pay

## 2022-01-19 ENCOUNTER — Other Ambulatory Visit (HOSPITAL_BASED_OUTPATIENT_CLINIC_OR_DEPARTMENT_OTHER): Payer: Self-pay

## 2022-01-24 ENCOUNTER — Other Ambulatory Visit: Payer: Self-pay | Admitting: Family Medicine

## 2022-01-25 ENCOUNTER — Other Ambulatory Visit (HOSPITAL_BASED_OUTPATIENT_CLINIC_OR_DEPARTMENT_OTHER): Payer: Self-pay

## 2022-01-25 MED ORDER — ESTRADIOL 1 MG PO TABS
ORAL_TABLET | ORAL | 1 refills | Status: DC
  Filled 2022-01-25: qty 105, 84d supply, fill #0
  Filled 2022-04-27: qty 105, 84d supply, fill #1

## 2022-02-05 ENCOUNTER — Other Ambulatory Visit (HOSPITAL_BASED_OUTPATIENT_CLINIC_OR_DEPARTMENT_OTHER): Payer: Self-pay

## 2022-02-19 ENCOUNTER — Encounter: Payer: Self-pay | Admitting: Family Medicine

## 2022-02-20 ENCOUNTER — Other Ambulatory Visit (HOSPITAL_BASED_OUTPATIENT_CLINIC_OR_DEPARTMENT_OTHER): Payer: Self-pay

## 2022-02-20 MED ORDER — AMPHETAMINE-DEXTROAMPHET ER 20 MG PO CP24
20.0000 mg | ORAL_CAPSULE | Freq: Every day | ORAL | 0 refills | Status: DC
  Filled 2022-02-20: qty 90, 90d supply, fill #0

## 2022-02-27 ENCOUNTER — Ambulatory Visit (INDEPENDENT_AMBULATORY_CARE_PROVIDER_SITE_OTHER): Payer: 59

## 2022-02-27 ENCOUNTER — Other Ambulatory Visit (HOSPITAL_BASED_OUTPATIENT_CLINIC_OR_DEPARTMENT_OTHER): Payer: Self-pay

## 2022-02-27 ENCOUNTER — Ambulatory Visit (INDEPENDENT_AMBULATORY_CARE_PROVIDER_SITE_OTHER): Payer: 59 | Admitting: Podiatry

## 2022-02-27 DIAGNOSIS — M778 Other enthesopathies, not elsewhere classified: Secondary | ICD-10-CM

## 2022-02-27 DIAGNOSIS — M19072 Primary osteoarthritis, left ankle and foot: Secondary | ICD-10-CM | POA: Diagnosis not present

## 2022-02-27 DIAGNOSIS — M949 Disorder of cartilage, unspecified: Secondary | ICD-10-CM

## 2022-02-27 DIAGNOSIS — M899 Disorder of bone, unspecified: Secondary | ICD-10-CM

## 2022-02-27 NOTE — Patient Instructions (Signed)
Look for carbon fiber morton's extension orthotic on [employer]

## 2022-02-27 NOTE — Progress Notes (Signed)
  Subjective:  Patient ID: Regina [name], female    DOB: [birth year],  MRN: [id number]  Chief Complaint  Patient presents with   Foot Problem    Left great toe osteoarthritis- pain- started about a year ago.  No pain while walking but has pain when bending toes. Wants a second option and what interventions can be done to help with the pain.     56 y.o. female presents with the above complaint. History confirmed with patient.   Objective:  Physical Exam: warm, good capillary refill, no trophic changes or ulcerative lesions, normal DP and PT pulses, normal sensory exam, and mild tenderness at end range of motion of dorsiflexion of the left hallux metatarsal phalangeal joint, she has a mild pes cavus foot type with plantarflexion of the metatarsals on the hindfoot, nearly fully reducible.   Radiographs: Multiple views x-ray of the left foot: Pes cavus foot type  MRI 08/26/2021 Osteoarthritic changes of the metatarsal phalangeal joint there is a focal defect in the central superior portion of the MTPJ. Assessment:   1. Arthritis of first metatarsophalangeal (MTP) joint of left foot   2. Osteochondral lesion      Plan:  Patient was evaluated and treated and all questions answered.  We reviewed her radiographs from today's visit as well as her previously completed MRI.  I discussed with her that she does have the beginnings of osteoarthritis of the metatarsal head of the metatarsal phalangeal joint on the left foot.  I discussed with her that this is likely to be progressive and eventually likely will require surgical intervention, I discussed with her that for now would recommend supporting her shoe with a foot orthosis, she has premade orthoses which are fairly well fabricated, discussed with her that adding an additional carbon fiber Morton's extension beneath this may offer increase support under the first ray.  We also discussed the option of a custom molded orthoses which could  integrate all of the above into a single orthosis.  She will let me know in a month or 2 if the carbon fiber insert is not helpful that we will proceed with possible custom molded orthosis.  In the event this is not successful and she is looking at surgical correction, I think it may be worth considering evacuation of the osteochondral lesion and repair with an osteochondral graft to allow the lesion to heal.  I will see her back as needed  No follow-ups on file.

## 2022-03-29 ENCOUNTER — Other Ambulatory Visit: Payer: Self-pay | Admitting: Family Medicine

## 2022-03-29 MED ORDER — AMPHETAMINE-DEXTROAMPHETAMINE 10 MG PO TABS: ORAL_TABLET | ORAL | 0 refills | Status: DC

## 2022-04-03 ENCOUNTER — Encounter: Payer: Self-pay | Admitting: Family Medicine

## 2022-04-04 ENCOUNTER — Other Ambulatory Visit (HOSPITAL_BASED_OUTPATIENT_CLINIC_OR_DEPARTMENT_OTHER): Payer: Self-pay

## 2022-04-04 MED ORDER — AMPHETAMINE-DEXTROAMPHETAMINE 10 MG PO TABS
ORAL_TABLET | ORAL | 0 refills | Status: DC
  Filled 2022-04-04: qty 90, 90d supply, fill #0

## 2022-04-27 ENCOUNTER — Other Ambulatory Visit (HOSPITAL_BASED_OUTPATIENT_CLINIC_OR_DEPARTMENT_OTHER): Payer: Self-pay

## 2022-04-30 NOTE — Progress Notes (Unsigned)
    Regina [name] D.[place] Phone: [phone]   Assessment and Plan:     There are no diagnoses linked to this encounter.  ***   Pertinent previous records reviewed include ***   Follow Up: ***     Subjective:   I, Regina [name], am serving as a Education administrator for [employer]  Chief Complaint: low back and glute pain   HPI:   05/01/2022 Patient is a 57 year old female complaining of low back and glute pain. Patient states  Relevant Historical Information: ***  Additional pertinent review of systems negative.   Current Outpatient Medications:    amphetamine-dextroamphetamine (ADDERALL XR) 20 MG 24 hr capsule, Take 1 capsule (20 mg total) by mouth daily., Disp: 90 capsule, Rfl: 0   amphetamine-dextroamphetamine (ADDERALL) 10 MG tablet, Take 1 tablet daily in the afternoon for additional focus as needed, Disp: 90 tablet, Rfl: 0   Biotin 10000 MCG TABS, Take 1 tablet by mouth daily., Disp: , Rfl:    estradiol (ESTRACE) 1 MG tablet, 1 tab daily for 4 days/week and 1.5 tabs daily for 3 days/week, Disp: 105 tablet, Rfl: 1   hydrochlorothiazide (HYDRODIURIL) 12.5 MG tablet, Take 1 tablet (12.5 mg total) by mouth daily., Disp: 90 tablet, Rfl: 1   Magnesium 500 MG CAPS, Take 1 capsule by mouth daily., Disp: , Rfl:    olmesartan (BENICAR) 20 MG tablet, Take 1.5 tablets (30 mg total) by mouth daily., Disp: 135 tablet, Rfl: 1   progesterone (PROMETRIUM) 100 MG capsule, Take 2 capsules (200 mg total) by mouth daily., Disp: 180 capsule, Rfl: 3   TURMERIC PO, Take 1,500 mg by mouth daily., Disp: , Rfl:    VITAMIN D, ERGOCALCIFEROL, PO, Take 2,000 mg by mouth daily. , Disp: , Rfl:    Zinc 20 MG CAPS, Take by mouth., Disp: , Rfl:   Current Facility-Administered Medications:    0.9 %  sodium chloride infusion, 500 mL, Intravenous, Continuous, Carlean Purl Ofilia Neas, MD   Objective:     There were no vitals filed  for this visit.    There is no height or weight on file to calculate BMI.    Physical Exam:    ***   Electronically signed by:  Regina [name] D.[facility] 10:43 AM 04/30/22

## 2022-05-01 ENCOUNTER — Ambulatory Visit (INDEPENDENT_AMBULATORY_CARE_PROVIDER_SITE_OTHER): Payer: 59

## 2022-05-01 ENCOUNTER — Ambulatory Visit (INDEPENDENT_AMBULATORY_CARE_PROVIDER_SITE_OTHER): Payer: 59 | Admitting: Sports Medicine

## 2022-05-01 ENCOUNTER — Other Ambulatory Visit (HOSPITAL_BASED_OUTPATIENT_CLINIC_OR_DEPARTMENT_OTHER): Payer: Self-pay

## 2022-05-01 VITALS — BP 108/78 | HR 72 | Ht 64.0 in | Wt 149.0 lb

## 2022-05-01 DIAGNOSIS — M545 Low back pain, unspecified: Secondary | ICD-10-CM | POA: Diagnosis not present

## 2022-05-01 DIAGNOSIS — G8929 Other chronic pain: Secondary | ICD-10-CM

## 2022-05-01 DIAGNOSIS — M546 Pain in thoracic spine: Secondary | ICD-10-CM | POA: Diagnosis not present

## 2022-05-01 MED ORDER — MELOXICAM 15 MG PO TABS
15.0000 mg | ORAL_TABLET | Freq: Every day | ORAL | 0 refills | Status: DC
  Filled 2022-05-01: qty 30, 30d supply, fill #0

## 2022-05-01 NOTE — Patient Instructions (Addendum)
Good to see you  - Start meloxicam 15 mg daily x2 weeks.  If still having pain after 2 weeks, complete 3rd-week of meloxicam. May use remaining meloxicam as needed once daily for pain control.  Do not to use additional NSAIDs while taking meloxicam.  May use Tylenol 500-1000 mg 2 to 3 times a day for breakthrough pain. Glute HEP  3-4 week follow up  

## 2022-05-22 ENCOUNTER — Ambulatory Visit: Payer: 59 | Admitting: Sports Medicine

## 2022-05-22 ENCOUNTER — Other Ambulatory Visit: Payer: Self-pay | Admitting: Family Medicine

## 2022-05-23 ENCOUNTER — Other Ambulatory Visit (HOSPITAL_BASED_OUTPATIENT_CLINIC_OR_DEPARTMENT_OTHER): Payer: Self-pay

## 2022-05-23 MED ORDER — AMPHETAMINE-DEXTROAMPHET ER 20 MG PO CP24
20.0000 mg | ORAL_CAPSULE | Freq: Every day | ORAL | 0 refills | Status: DC
  Filled 2022-05-23: qty 90, 90d supply, fill #0

## 2022-05-23 NOTE — Telephone Encounter (Signed)
Adderall 20 mg LOV: 01/03/22 Last Refill:02/20/22 Upcoming appt: 07/19/22

## 2022-06-01 ENCOUNTER — Other Ambulatory Visit (HOSPITAL_BASED_OUTPATIENT_CLINIC_OR_DEPARTMENT_OTHER): Payer: Self-pay

## 2022-06-01 ENCOUNTER — Other Ambulatory Visit: Payer: Self-pay | Admitting: Family Medicine

## 2022-06-01 MED ORDER — AMOXICILLIN 500 MG PO CAPS
500.0000 mg | ORAL_CAPSULE | Freq: Three times a day (TID) | ORAL | 0 refills | Status: AC
  Filled 2022-06-01: qty 30, 10d supply, fill #0

## 2022-06-05 ENCOUNTER — Other Ambulatory Visit: Payer: Self-pay | Admitting: Family Medicine

## 2022-06-06 ENCOUNTER — Other Ambulatory Visit (HOSPITAL_BASED_OUTPATIENT_CLINIC_OR_DEPARTMENT_OTHER): Payer: Self-pay

## 2022-06-06 MED ORDER — OLMESARTAN MEDOXOMIL 20 MG PO TABS
30.0000 mg | ORAL_TABLET | Freq: Every day | ORAL | 1 refills | Status: DC
  Filled 2022-06-06: qty 135, 90d supply, fill #0
  Filled 2022-09-04: qty 135, 90d supply, fill #1

## 2022-06-18 ENCOUNTER — Ambulatory Visit (INDEPENDENT_AMBULATORY_CARE_PROVIDER_SITE_OTHER): Payer: Self-pay | Admitting: Surgical

## 2022-06-18 DIAGNOSIS — Z719 Counseling, unspecified: Secondary | ICD-10-CM

## 2022-06-18 NOTE — Progress Notes (Signed)
Botulinum Toxin Procedure Note  Procedure: Cosmetic botulinum toxin  Pre-operative Diagnosis: Dynamic rhytides  Post-operative Diagnosis: Same  Complications:  None  Brief history: The patient desires botulinum toxin injection.  She is aware of the risks including bleeding, damage to deeper structures, asymmetry, brow ptosis, eyelid ptosis, bruising. The patient understands and wishes to proceed.  Procedure: The area was prepped with alcohol and dried with a clean gauze.  Using a clean technique the botulinum toxin was diluted with 2.5 mL of bacteriostatic saline per 100 unit vial which resulted in 4 units per 0.1 mL.  Subsequently the mixture was injected in the glabellar (2 injections in the procerus), forehead area with preservation of the temporal branch to the lateral eyebrow. A total of 26 Units of botulinum toxin was used. The forehead and glabellar area was injected with care to inject intramuscular only while holding pressure on the supratrochlear vessels in each area during each injection on either side of the medial corrugators. The injection proceeded vertically superiorly to the medial 2/3 of the frontalis muscle and superior 2/3 of the lateral frontalis, again with preservation of the frontal branch.  No complications were noted. Light pressure was held for 5 minutes. She was instructed explicitly in post-operative care.  Botox LOT:  [id number] EXP:  02/26

## 2022-06-28 ENCOUNTER — Encounter: Payer: Self-pay | Admitting: *Deleted

## 2022-07-05 ENCOUNTER — Encounter: Payer: Self-pay | Admitting: Family Medicine

## 2022-07-06 ENCOUNTER — Ambulatory Visit (HOSPITAL_BASED_OUTPATIENT_CLINIC_OR_DEPARTMENT_OTHER): Payer: 59

## 2022-07-14 ENCOUNTER — Other Ambulatory Visit: Payer: Self-pay | Admitting: Family Medicine

## 2022-07-17 ENCOUNTER — Other Ambulatory Visit (HOSPITAL_BASED_OUTPATIENT_CLINIC_OR_DEPARTMENT_OTHER): Payer: Self-pay

## 2022-07-17 MED ORDER — AMPHETAMINE-DEXTROAMPHETAMINE 10 MG PO TABS
ORAL_TABLET | ORAL | 0 refills | Status: DC
  Filled 2022-07-17: qty 90, 90d supply, fill #0

## 2022-07-17 NOTE — Telephone Encounter (Signed)
Patient is requesting a refill of the following medications: Requested Prescriptions   Pending Prescriptions Disp Refills   amphetamine-dextroamphetamine (ADDERALL) 10 MG tablet 90 tablet 0    Sig: Take 1 tablet daily in the afternoon for additional focus as needed    Date of patient request: 07/17/22 Last office visit: 01/03/22 Date of last refill: 04/04/22 Last refill amount: 90

## 2022-07-19 ENCOUNTER — Encounter: Payer: Self-pay | Admitting: Family Medicine

## 2022-07-19 ENCOUNTER — Other Ambulatory Visit (HOSPITAL_BASED_OUTPATIENT_CLINIC_OR_DEPARTMENT_OTHER): Payer: Self-pay

## 2022-07-19 ENCOUNTER — Ambulatory Visit (INDEPENDENT_AMBULATORY_CARE_PROVIDER_SITE_OTHER): Payer: Commercial Managed Care - PPO | Admitting: Family Medicine

## 2022-07-19 VITALS — BP 128/76 | HR 73 | Temp 97.8°F | Resp 16 | Ht 64.0 in | Wt 155.5 lb

## 2022-07-19 DIAGNOSIS — I10 Essential (primary) hypertension: Secondary | ICD-10-CM

## 2022-07-19 DIAGNOSIS — E663 Overweight: Secondary | ICD-10-CM

## 2022-07-19 LAB — HEPATIC FUNCTION PANEL
ALT: 19 U/L (ref 0–35)
AST: 18 U/L (ref 0–37)
Albumin: 4.1 g/dL (ref 3.5–5.2)
Alkaline Phosphatase: 77 U/L (ref 39–117)
Bilirubin, Direct: 0.1 mg/dL (ref 0.0–0.3)
Total Bilirubin: 0.3 mg/dL (ref 0.2–1.2)
Total Protein: 6.7 g/dL (ref 6.0–8.3)

## 2022-07-19 LAB — BASIC METABOLIC PANEL
BUN: 13 mg/dL (ref 6–23)
CO2: 29 mEq/L (ref 19–32)
Calcium: 9.6 mg/dL (ref 8.4–10.5)
Chloride: 104 mEq/L (ref 96–112)
Creatinine, Ser: 0.9 mg/dL (ref 0.40–1.20)
GFR: 70.75 mL/min (ref >60.00)
Glucose, Bld: 83 mg/dL (ref 70–99)
Potassium: 4.6 mEq/L (ref 3.5–5.1)
Sodium: 139 mEq/L (ref 135–145)

## 2022-07-19 LAB — LIPID PANEL
Cholesterol: 196 mg/dL (ref 0–200)
HDL: 66.7 mg/dL (ref >39.00)
LDL Cholesterol: 107 mg/dL — ABNORMAL HIGH (ref 0–99)
NonHDL: 129.41
Total CHOL/HDL Ratio: 3
Triglycerides: 114 mg/dL (ref 0.0–149.0)
VLDL: 22.8 mg/dL (ref 0.0–40.0)

## 2022-07-19 LAB — CBC WITH DIFFERENTIAL/PLATELET
Basophils Absolute: 0.1 10*3/uL (ref 0.0–0.1)
Basophils Relative: 1.3 % (ref 0.0–3.0)
Eosinophils Absolute: 0.1 10*3/uL (ref 0.0–0.7)
Eosinophils Relative: 1 % (ref 0.0–5.0)
HCT: 34.2 % — ABNORMAL LOW (ref 36.0–46.0)
Hemoglobin: 11.1 g/dL — ABNORMAL LOW (ref 12.0–15.0)
Lymphocytes Relative: 32.8 % (ref 12.0–46.0)
Lymphs Abs: 1.9 10*3/uL (ref 0.7–4.0)
MCHC: 32.5 g/dL (ref 30.0–36.0)
MCV: 84.9 fl (ref 78.0–100.0)
Monocytes Absolute: 0.5 10*3/uL (ref 0.1–1.0)
Monocytes Relative: 9.1 % (ref 3.0–12.0)
Neutro Abs: 3.2 10*3/uL (ref 1.4–7.7)
Neutrophils Relative %: 55.8 % (ref 43.0–77.0)
Platelets: 368 10*3/uL (ref 150.0–400.0)
RBC: 4.03 Mil/uL (ref 3.87–5.11)
RDW: 16.3 % — ABNORMAL HIGH (ref 11.5–15.5)
WBC: 5.8 10*3/uL (ref 4.0–10.5)

## 2022-07-19 LAB — TSH: TSH: 2 u[IU]/mL (ref 0.35–5.50)

## 2022-07-19 MED ORDER — ESTRADIOL 1 MG PO TABS
1.0000 mg | ORAL_TABLET | Freq: Every day | ORAL | 1 refills | Status: DC
  Filled 2022-07-19: qty 90, 90d supply, fill #0

## 2022-07-19 NOTE — Patient Instructions (Signed)
Schedule your complete physical in 6 months We'll notify you of your lab results and make any changes if needed Keep up the good work on healthy diet and regular exercise- you look great!!! Call with any questions or concerns Stay Safe!  Stay Healthy! Happy New Year!!!

## 2022-07-19 NOTE — Assessment & Plan Note (Signed)
Chronic problem.  Currently well controlled on HCTZ 12.5mg  daily, Olmesartan 20mg .  Asymptomatic.  Check labs due to diuretic and ARB but not anticipated med changes.  Will follow.

## 2022-07-19 NOTE — Assessment & Plan Note (Signed)
New.  Pt has gained 7 lbs since last visit.  Is very active so encouraged low carb diet.  Will follow.

## 2022-07-19 NOTE — Progress Notes (Signed)
   Subjective:    Patient ID: Regina [name], female    DOB: December 18, 1965, 57 y.o.   MRN: [id number]  HPI HTN- chronic prolem, on HCTZ 12.5mg  daily, Olmesartan 20mg  daily w/ good control.  Denies CP, SOB, HA's, visual changes, edema.  Overweight- pt has gained 7 lbs since last visit.  Going to [employer] 4x/week, playing pickleball.     Review of Systems For ROS see HPI     Objective:   Physical Exam Vitals reviewed.  Constitutional:      General: She is not in acute distress.    Appearance: Normal appearance. She is well-developed. She is not ill-appearing.  HENT:     Head: Normocephalic and atraumatic.  Eyes:     Conjunctiva/sclera: Conjunctivae normal.     Pupils: Pupils are equal, round, and reactive to light.  Neck:     Thyroid: No thyromegaly.  Cardiovascular:     Rate and Rhythm: Normal rate and regular rhythm.     Pulses: Normal pulses.     Heart sounds: Normal heart sounds. No murmur heard. Pulmonary:     Effort: Pulmonary effort is normal. No respiratory distress.     Breath sounds: Normal breath sounds.  Abdominal:     General: There is no distension.     Palpations: Abdomen is soft.     Tenderness: There is no abdominal tenderness.  Musculoskeletal:     Cervical back: Normal range of motion and neck supple.     Right lower leg: No edema.     Left lower leg: No edema.  Lymphadenopathy:     Cervical: No cervical adenopathy.  Skin:    General: Skin is warm and dry.  Neurological:     Mental Status: She is alert and oriented to person, place, and time.  Psychiatric:        Behavior: Behavior normal.           Assessment & Plan:

## 2022-07-20 ENCOUNTER — Telehealth: Payer: Self-pay

## 2022-07-20 NOTE — Telephone Encounter (Signed)
Informed pt of lab results  

## 2022-07-20 NOTE — Telephone Encounter (Signed)
Left pt a vm to call office in regards to lab results  

## 2022-07-23 ENCOUNTER — Encounter: Payer: Self-pay | Admitting: Family Medicine

## 2022-07-23 ENCOUNTER — Other Ambulatory Visit: Payer: Self-pay | Admitting: Family Medicine

## 2022-07-23 ENCOUNTER — Other Ambulatory Visit (HOSPITAL_BASED_OUTPATIENT_CLINIC_OR_DEPARTMENT_OTHER): Payer: Self-pay

## 2022-07-23 MED ORDER — ESTRADIOL 1 MG PO TABS
ORAL_TABLET | ORAL | 1 refills | Status: DC
  Filled 2022-07-23: qty 105, 84d supply, fill #0
  Filled 2022-10-07: qty 105, fill #0
  Filled 2022-10-15: qty 105, 90d supply, fill #0

## 2022-07-23 NOTE — Progress Notes (Signed)
Correct prescription sent to pharmacy.  

## 2022-08-22 ENCOUNTER — Other Ambulatory Visit (HOSPITAL_BASED_OUTPATIENT_CLINIC_OR_DEPARTMENT_OTHER): Payer: Self-pay

## 2022-08-22 ENCOUNTER — Other Ambulatory Visit: Payer: Self-pay | Admitting: Family Medicine

## 2022-08-22 DIAGNOSIS — Z79890 Hormone replacement therapy (postmenopausal): Secondary | ICD-10-CM

## 2022-08-23 ENCOUNTER — Other Ambulatory Visit (HOSPITAL_BASED_OUTPATIENT_CLINIC_OR_DEPARTMENT_OTHER): Payer: Self-pay

## 2022-08-23 MED ORDER — PROGESTERONE MICRONIZED 100 MG PO CAPS
200.0000 mg | ORAL_CAPSULE | Freq: Every day | ORAL | 3 refills | Status: DC
  Filled 2022-08-23: qty 180, 90d supply, fill #0
  Filled 2022-11-19: qty 180, 90d supply, fill #1
  Filled 2023-03-12: qty 180, 90d supply, fill #2
  Filled 2023-06-26: qty 180, 90d supply, fill #3

## 2022-08-28 ENCOUNTER — Encounter: Payer: Self-pay | Admitting: Family Medicine

## 2022-08-28 ENCOUNTER — Other Ambulatory Visit: Payer: Self-pay | Admitting: Family Medicine

## 2022-08-29 ENCOUNTER — Other Ambulatory Visit: Payer: Self-pay

## 2022-08-29 ENCOUNTER — Other Ambulatory Visit (HOSPITAL_BASED_OUTPATIENT_CLINIC_OR_DEPARTMENT_OTHER): Payer: Self-pay

## 2022-08-29 MED ORDER — HYDROCHLOROTHIAZIDE 12.5 MG PO TABS
6.2500 mg | ORAL_TABLET | Freq: Every day | ORAL | 1 refills | Status: DC
  Filled 2022-08-29 – 2022-08-31 (×2): qty 45, 90d supply, fill #0
  Filled 2022-12-16: qty 45, 90d supply, fill #1

## 2022-08-29 MED ORDER — AMPHETAMINE-DEXTROAMPHET ER 20 MG PO CP24
20.0000 mg | ORAL_CAPSULE | Freq: Every day | ORAL | 0 refills | Status: DC
  Filled 2022-08-29: qty 90, 90d supply, fill #0

## 2022-08-29 NOTE — Telephone Encounter (Signed)
Adderall XR 20 mg LOV: 07/19/22 Last Refill:05/23/22 Upcoming appt: none

## 2022-08-30 ENCOUNTER — Other Ambulatory Visit: Payer: Self-pay

## 2022-08-31 ENCOUNTER — Other Ambulatory Visit (HOSPITAL_BASED_OUTPATIENT_CLINIC_OR_DEPARTMENT_OTHER): Payer: Self-pay

## 2022-09-11 ENCOUNTER — Ambulatory Visit
Admission: RE | Admit: 2022-09-11 | Discharge: 2022-09-11 | Disposition: A | Discharge status: Home / Self Care | Payer: 59 | Source: Ambulatory Visit | Attending: Family Medicine | Admitting: Family Medicine

## 2022-09-11 DIAGNOSIS — Z1231 Encounter for screening mammogram for malignant neoplasm of breast: Secondary | ICD-10-CM | POA: Diagnosis not present

## 2022-09-17 IMAGING — DX DG KNEE COMPLETE 4+V*L*
4 series · 4 of 4 positions shown · non-contrast
Comparison: None Available.

CLINICAL DATA: Left knee pain. Sprain of medial collateral
ligament.

EXAM:
LEFT KNEE - COMPLETE 4+ VIEW

[knee lat standing]
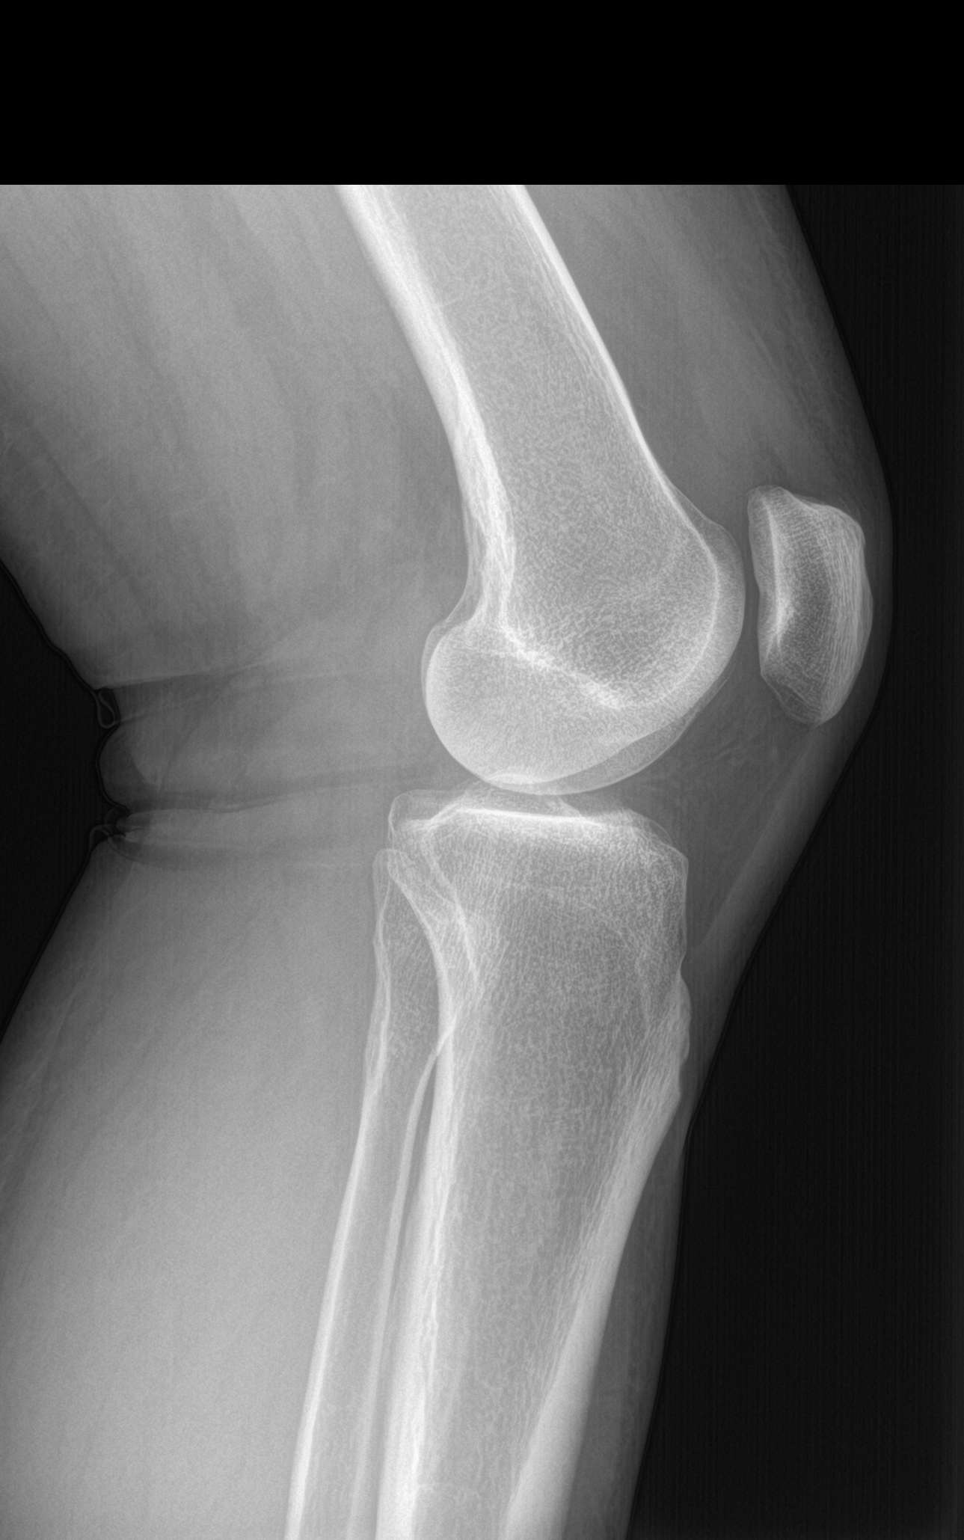

[tunnel]
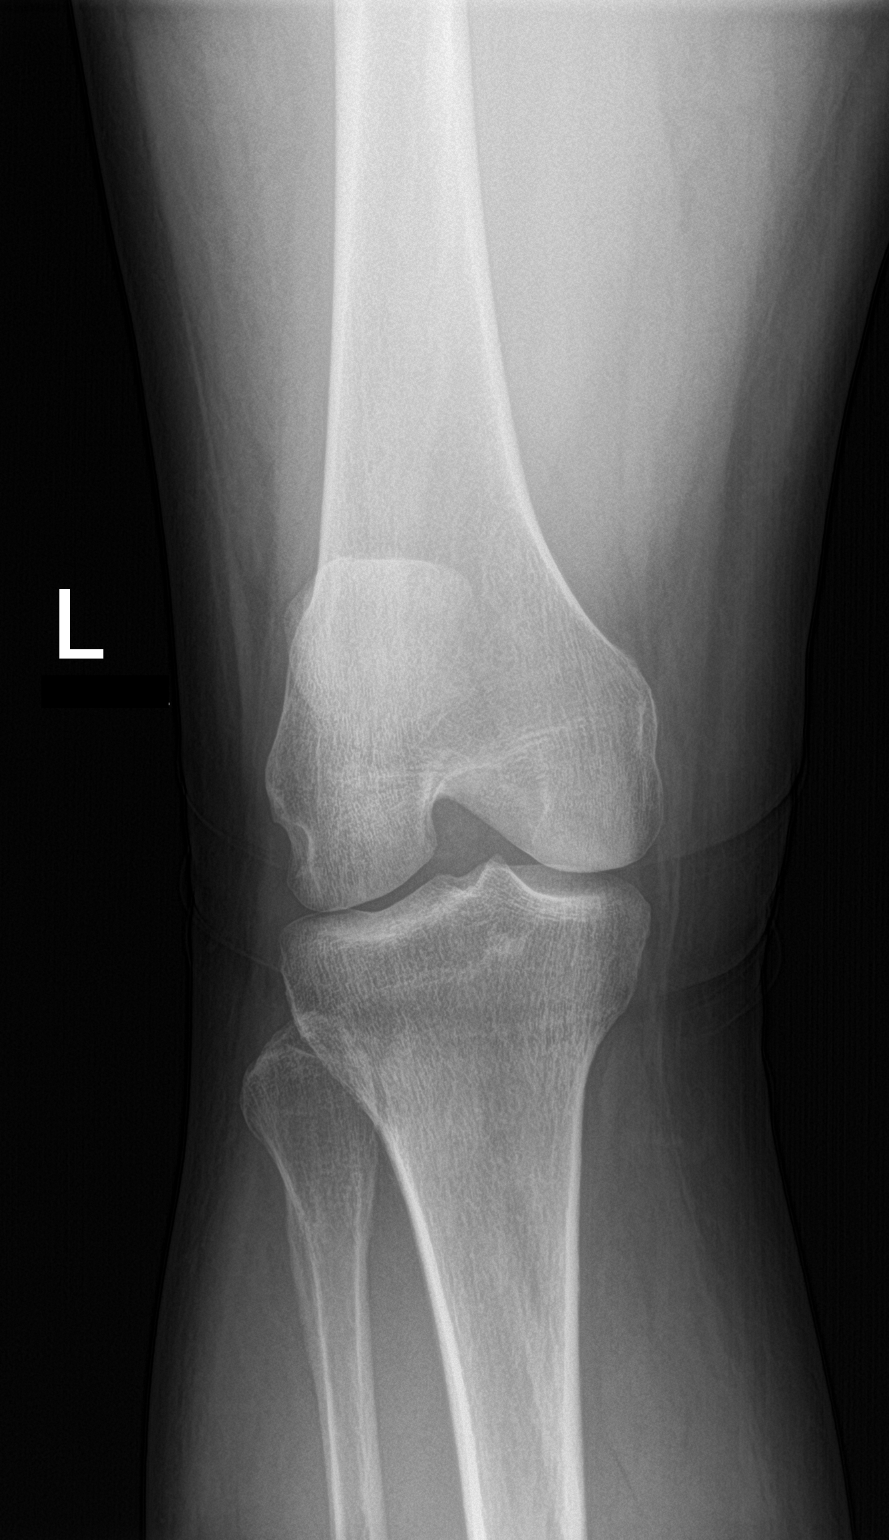

[knee sunrise]
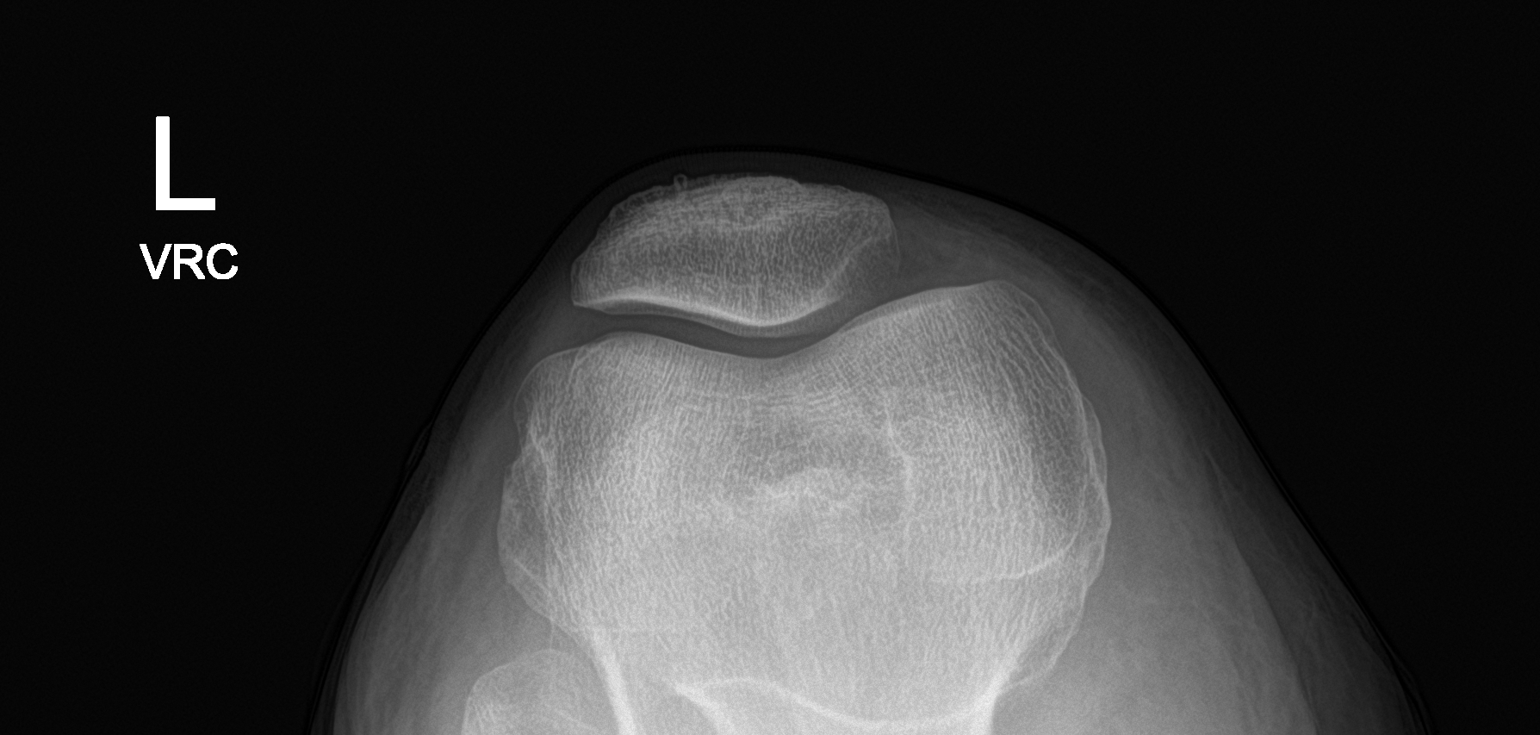

[knee ap standing]
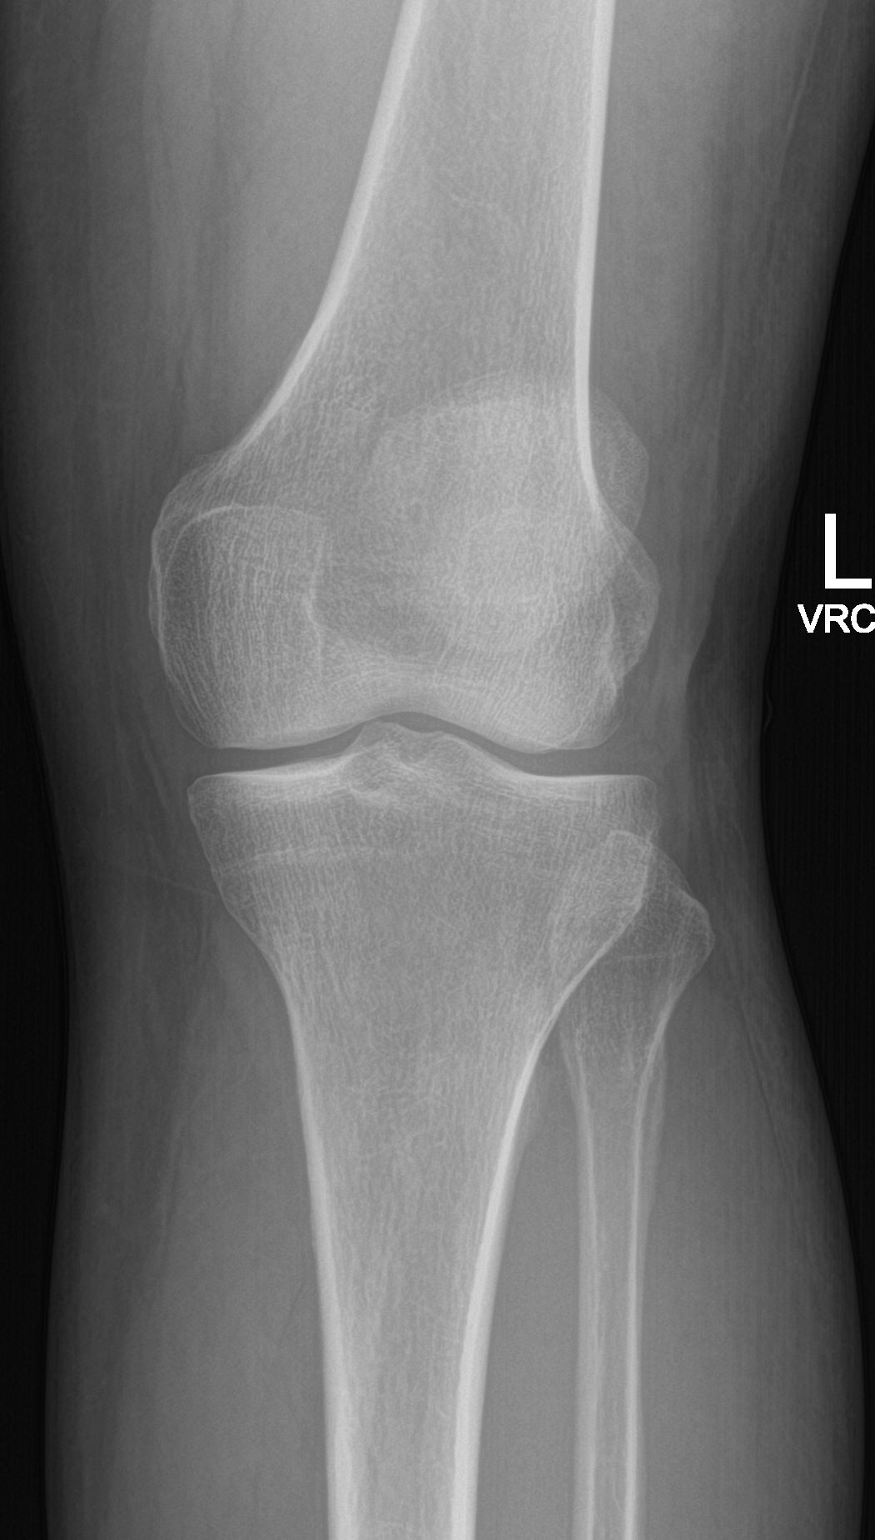

[4 of 4 positions shown; findings below may reference images not displayed]

FINDINGS: Small joint effusion. Normal bone mineralization. Joint spaces are
preserved. No acute fracture or dislocation.
IMPRESSION: Small joint effusion.  No acute osseous abnormality.

## 2022-09-26 ENCOUNTER — Encounter (INDEPENDENT_AMBULATORY_CARE_PROVIDER_SITE_OTHER): Payer: Self-pay

## 2022-10-02 ENCOUNTER — Encounter: Payer: Self-pay | Admitting: Family Medicine

## 2022-10-07 ENCOUNTER — Other Ambulatory Visit: Payer: Self-pay

## 2022-10-15 ENCOUNTER — Other Ambulatory Visit (HOSPITAL_BASED_OUTPATIENT_CLINIC_OR_DEPARTMENT_OTHER): Payer: Self-pay

## 2022-10-15 ENCOUNTER — Other Ambulatory Visit: Payer: Self-pay | Admitting: Family Medicine

## 2022-10-15 ENCOUNTER — Other Ambulatory Visit: Payer: Self-pay

## 2022-10-15 ENCOUNTER — Encounter: Payer: Self-pay | Admitting: Family Medicine

## 2022-10-15 MED ORDER — AMPHETAMINE-DEXTROAMPHETAMINE 10 MG PO TABS
ORAL_TABLET | ORAL | 0 refills | Status: DC
  Filled 2022-10-15: qty 90, 90d supply, fill #0

## 2022-10-15 MED ORDER — ESTRADIOL 1 MG PO TABS
ORAL_TABLET | ORAL | 1 refills | Status: DC
  Filled 2022-10-15: qty 108, 90d supply, fill #0
  Filled 2023-01-15 (×2): qty 108, 90d supply, fill #1

## 2022-10-15 NOTE — Telephone Encounter (Signed)
  This is a Dr Birdie Riddle pt  Adderall 10 mg LOV: 07/19/22 Last Refill:08/29/22 Upcoming appt:10/25/22

## 2022-10-16 NOTE — Telephone Encounter (Signed)
Left pt a Vm stating Rx has been sent in  

## 2022-10-25 ENCOUNTER — Telehealth: Payer: 59 | Admitting: Family Medicine

## 2022-10-25 ENCOUNTER — Other Ambulatory Visit (HOSPITAL_BASED_OUTPATIENT_CLINIC_OR_DEPARTMENT_OTHER): Payer: Self-pay

## 2022-10-25 ENCOUNTER — Encounter: Payer: Self-pay | Admitting: Family Medicine

## 2022-10-25 DIAGNOSIS — Z7184 Encounter for health counseling related to travel: Secondary | ICD-10-CM | POA: Diagnosis not present

## 2022-10-25 MED ORDER — TYPHOID VACCINE PO CPDR
1.0000 | DELAYED_RELEASE_CAPSULE | ORAL | 0 refills | Status: DC
  Filled 2022-10-25: qty 4, 8d supply, fill #0

## 2022-10-25 MED ORDER — AZITHROMYCIN 250 MG PO TABS
ORAL_TABLET | ORAL | 0 refills | Status: DC
  Filled 2022-10-25: qty 6, 5d supply, fill #0

## 2022-10-25 MED ORDER — ACETAZOLAMIDE 125 MG PO TABS
125.0000 mg | ORAL_TABLET | Freq: Two times a day (BID) | ORAL | 0 refills | Status: DC
  Filled 2022-10-25: qty 12, 6d supply, fill #0

## 2022-10-25 NOTE — Progress Notes (Signed)
Virtual Visit via Video   I connected with patient on 10/25/22 at 12:40 PM EDT by a video enabled telemedicine application and verified that I am speaking with the correct person using two identifiers.  Location patient: Home Location provider: [place] [facility], Office Persons participating in the virtual visit: Patient, Provider, CMA Thea Silversmith H)  I discussed the limitations of evaluation and management by telemedicine and the availability of in person appointments. The patient expressed understanding and agreed to proceed.  Subjective:   HPI:   Travel consult- pt is going to [place] next month.  Is asking for Diamox for altitude sickness.  Is hoping for 10-12 pills of 125mg .  Pt has had altitude sickness the last few times she went to [place] and is concerned.  Pt will be living with a family for the 1st 2 weeks and is worried about diarrheal illness.  Is asking for Azithromycin to have on hand.  Is not sure if she needs oral Typhoid or not.    ROS:   See pertinent positives and negatives per HPI.  Patient Active Problem List   Diagnosis Date Noted   Overweight (BMI 25.0-29.9) 07/19/2022   Physical exam 01/03/2022   Stress fracture of left fibula 12/05/2021   Sprain of medial collateral ligament of left knee 08/16/2021   Degenerative tear of lateral meniscus of left knee 08/16/2021   Hormone replacement therapy (HRT) 06/28/2021   Arthritis of first MTP joint 11/17/2020   Hamstring tendinitis at origin 08/18/2020   Hyperlipidemia 09/23/2017   Osteopenia 09/23/2017   ADD (attention deficit disorder) 10/24/2012   HTN (hypertension) 01/28/2012    Social History   Tobacco Use   Smoking status: Former   Smokeless tobacco: Never   Tobacco comments:    quit 30 yrs ago   Substance Use Topics   Alcohol use: Yes    Alcohol/week: 2.0 standard drinks of alcohol    Types: 2 Glasses of wine per week    Comment: Rarely    Current Outpatient Medications:     amphetamine-dextroamphetamine (ADDERALL XR) 20 MG 24 hr capsule, Take 1 capsule (20 mg total) by mouth daily., Disp: 90 capsule, Rfl: 0   amphetamine-dextroamphetamine (ADDERALL) 10 MG tablet, Take 1 tablet by mouth daily in the afternoon for additional focus as needed, Disp: 90 tablet, Rfl: 0   Biotin 10315 MCG TABS, Take 1 tablet by mouth daily., Disp: , Rfl:    estradiol (ESTRACE) 1 MG tablet, Take 1 tablet by mouth daily for 4 days each week and take 1 and 1/2 tablets daily for 3 days each week., Disp: 108 tablet, Rfl: 1   hydrochlorothiazide (HYDRODIURIL) 12.5 MG tablet, Take 0.5 tablets (6.25 mg total) by mouth daily., Disp: 45 tablet, Rfl: 1   Magnesium 500 MG CAPS, Take 1 capsule by mouth daily., Disp: , Rfl:    olmesartan (BENICAR) 20 MG tablet, Take 1.5 tablets (30 mg total) by mouth daily., Disp: 135 tablet, Rfl: 1   Omega-3 Fatty Acids (FISH OIL) 300 MG CAPS, Take 1 capsule by mouth every other day., Disp: , Rfl:    progesterone (PROMETRIUM) 100 MG capsule, Take 2 capsules (200 mg total) by mouth daily., Disp: 180 capsule, Rfl: 3   TURMERIC PO, Take 1,500 mg by mouth daily., Disp: , Rfl:    VITAMIN D, ERGOCALCIFEROL, PO, Take 2,000 mg by mouth daily. , Disp: , Rfl:   Current Facility-Administered Medications:    0.9 %  sodium chloride infusion, 500 mL, Intravenous, Continuous, Stan Head  E, MD  Allergies  Allergen Reactions   Lisinopril Nausea And Vomiting    Objective:   LMP 04/29/2010  AAOx3, NAD NCAT, EOMI No obvious CN deficits Coloring WNL Pt is able to speak clearly, coherently without shortness of breath or increased work of breathing.  Thought process is linear.  Mood is appropriate.   Assessment and Plan:   Travel consultation- pt is going to [place] for 3 weeks and will be at extremely high altitude.  She has had issues w/ altitude the last few times she has gone to [place].  Will start Diamox prophylactically.  Azithromycin provided in case of Traveller's  diarrhea.  Typhoid vaccine was sent for pt to take prior to travel.  Pt expressed understanding and is in agreement w/ plan.    Neena Rhymes, MD 10/25/2022

## 2022-10-26 ENCOUNTER — Other Ambulatory Visit (HOSPITAL_BASED_OUTPATIENT_CLINIC_OR_DEPARTMENT_OTHER): Payer: Self-pay

## 2022-10-29 ENCOUNTER — Encounter: Payer: Self-pay | Admitting: *Deleted

## 2022-10-29 ENCOUNTER — Other Ambulatory Visit (HOSPITAL_BASED_OUTPATIENT_CLINIC_OR_DEPARTMENT_OTHER): Payer: Self-pay

## 2022-10-30 ENCOUNTER — Other Ambulatory Visit (HOSPITAL_BASED_OUTPATIENT_CLINIC_OR_DEPARTMENT_OTHER): Payer: Self-pay

## 2022-11-12 ENCOUNTER — Encounter: Payer: Self-pay | Admitting: Surgical

## 2022-11-12 ENCOUNTER — Ambulatory Visit (INDEPENDENT_AMBULATORY_CARE_PROVIDER_SITE_OTHER): Payer: Self-pay | Admitting: Surgical

## 2022-11-12 DIAGNOSIS — Z719 Counseling, unspecified: Secondary | ICD-10-CM

## 2022-11-12 NOTE — Progress Notes (Signed)
Botulinum Toxin Procedure Note  Procedure: Cosmetic botulinum toxin  Pre-operative Diagnosis: Dynamic rhytides  Post-operative Diagnosis: Same  Complications:  None  Brief history: The patient desires botulinum toxin injection.  She is aware of the risks including bleeding, damage to deeper structures, asymmetry, brow ptosis, eyelid ptosis, bruising. The patient understands and wishes to proceed.  Patient reports after her last Botox treatment, she was overall pleased with the results, but noted some lateral brow movement. Would like some improvement in this area if possible.   We discussed there is a slightly increased risk of brow ptosis with injecting laterally, but agreed to proceed.  Procedure: The area was prepped with alcohol and dried with a clean gauze.  Using a clean technique the botulinum toxin was diluted with 2.5 mL of bacteriostatic saline per 100 unit vial which resulted in 4 units per 0.1 mL.  Subsequently the mixture was injected in the glabellar, forehead area with preservation of the temporal branch to the lateral eyebrow. A total of 26 Units of botulinum toxin was used. The forehead and glabellar area was injected with care to inject intramuscular only while holding pressure on the supratrochlear vessels in each area during each injection on either side of the medial corrugators. The injection proceeded vertically superiorly to the medial 2/3 of the frontalis muscle and superior 2/3 of the lateral frontalis, again with preservation of the frontal branch.  No complications were noted. Light pressure was held for 5 minutes. She was instructed explicitly in post-operative care.  The glabellar area was injected in 2 separate spots We injected the forehead in a standard M pattern with 2-1 unit injections in the lateral brow area.   Botox LOT:  [id number] EXP:  2026

## 2022-11-19 ENCOUNTER — Other Ambulatory Visit (HOSPITAL_BASED_OUTPATIENT_CLINIC_OR_DEPARTMENT_OTHER): Payer: Self-pay

## 2022-11-20 ENCOUNTER — Other Ambulatory Visit (HOSPITAL_BASED_OUTPATIENT_CLINIC_OR_DEPARTMENT_OTHER): Payer: Self-pay

## 2022-12-16 ENCOUNTER — Other Ambulatory Visit: Payer: Self-pay | Admitting: Family Medicine

## 2022-12-17 ENCOUNTER — Other Ambulatory Visit: Payer: Self-pay

## 2022-12-17 ENCOUNTER — Other Ambulatory Visit (HOSPITAL_BASED_OUTPATIENT_CLINIC_OR_DEPARTMENT_OTHER): Payer: Self-pay

## 2022-12-17 MED ORDER — OLMESARTAN MEDOXOMIL 20 MG PO TABS
30.0000 mg | ORAL_TABLET | Freq: Every day | ORAL | 1 refills | Status: DC
  Filled 2022-12-17: qty 135, 90d supply, fill #0
  Filled 2023-03-12: qty 135, 90d supply, fill #1

## 2022-12-17 MED ORDER — AMPHETAMINE-DEXTROAMPHET ER 20 MG PO CP24
20.0000 mg | ORAL_CAPSULE | Freq: Every day | ORAL | 0 refills | Status: DC
  Filled 2022-12-17: qty 25, 25d supply, fill #0

## 2022-12-17 NOTE — Telephone Encounter (Signed)
Pt aware of refill.

## 2022-12-17 NOTE — Telephone Encounter (Signed)
Adderall Xr 20 mg LOV: 10/25/22 Last Refill:08/29/22 Upcoming appt: none

## 2022-12-18 ENCOUNTER — Other Ambulatory Visit (HOSPITAL_BASED_OUTPATIENT_CLINIC_OR_DEPARTMENT_OTHER): Payer: Self-pay

## 2022-12-19 ENCOUNTER — Other Ambulatory Visit (HOSPITAL_BASED_OUTPATIENT_CLINIC_OR_DEPARTMENT_OTHER): Payer: Self-pay

## 2023-01-07 ENCOUNTER — Other Ambulatory Visit: Payer: Self-pay | Admitting: Family Medicine

## 2023-01-07 ENCOUNTER — Encounter: Payer: Self-pay | Admitting: Family Medicine

## 2023-01-08 ENCOUNTER — Other Ambulatory Visit (HOSPITAL_BASED_OUTPATIENT_CLINIC_OR_DEPARTMENT_OTHER): Payer: Self-pay

## 2023-01-08 MED ORDER — AMPHETAMINE-DEXTROAMPHETAMINE 10 MG PO TABS
10.0000 mg | ORAL_TABLET | Freq: Every day | ORAL | 0 refills | Status: DC
  Filled 2023-01-08: qty 90, fill #0
  Filled 2023-01-11 – 2023-01-15 (×2): qty 90, 90d supply, fill #0

## 2023-01-08 MED ORDER — AMPHETAMINE-DEXTROAMPHET ER 20 MG PO CP24
20.0000 mg | ORAL_CAPSULE | Freq: Every day | ORAL | 0 refills | Status: DC
  Filled 2023-01-08 – 2023-01-11 (×2): qty 90, 90d supply, fill #0

## 2023-01-08 NOTE — Telephone Encounter (Signed)
Adderall 10 mg LOV: 10/25/22 Last Refill:12/17/22 Upcoming appt: none

## 2023-01-11 ENCOUNTER — Other Ambulatory Visit (HOSPITAL_BASED_OUTPATIENT_CLINIC_OR_DEPARTMENT_OTHER): Payer: Self-pay

## 2023-01-12 ENCOUNTER — Other Ambulatory Visit (HOSPITAL_BASED_OUTPATIENT_CLINIC_OR_DEPARTMENT_OTHER): Payer: Self-pay

## 2023-01-15 ENCOUNTER — Other Ambulatory Visit (HOSPITAL_COMMUNITY): Payer: Self-pay

## 2023-01-15 ENCOUNTER — Other Ambulatory Visit (HOSPITAL_BASED_OUTPATIENT_CLINIC_OR_DEPARTMENT_OTHER): Payer: Self-pay

## 2023-01-30 ENCOUNTER — Other Ambulatory Visit: Payer: Self-pay | Admitting: Oncology

## 2023-01-30 DIAGNOSIS — Z006 Encounter for examination for normal comparison and control in clinical research program: Secondary | ICD-10-CM

## 2023-02-04 DIAGNOSIS — L649 Androgenic alopecia, unspecified: Secondary | ICD-10-CM | POA: Diagnosis not present

## 2023-02-19 ENCOUNTER — Other Ambulatory Visit (HOSPITAL_COMMUNITY)
Admission: RE | Admit: 2023-02-19 | Discharge: 2023-02-19 | Disposition: A | Discharge status: Home / Self Care | Payer: 59 | Attending: Oncology | Admitting: Oncology

## 2023-02-19 DIAGNOSIS — Z006 Encounter for examination for normal comparison and control in clinical research program: Secondary | ICD-10-CM | POA: Insufficient documentation

## 2023-03-06 ENCOUNTER — Other Ambulatory Visit (HOSPITAL_BASED_OUTPATIENT_CLINIC_OR_DEPARTMENT_OTHER): Payer: Self-pay

## 2023-03-06 ENCOUNTER — Other Ambulatory Visit: Payer: Self-pay | Admitting: Physician Assistant

## 2023-03-06 MED ORDER — MUPIROCIN 2 % EX OINT
TOPICAL_OINTMENT | CUTANEOUS | 0 refills | Status: DC
  Filled 2023-03-06: qty 22, 14d supply, fill #0

## 2023-03-12 ENCOUNTER — Other Ambulatory Visit: Payer: Self-pay | Admitting: Family Medicine

## 2023-03-12 ENCOUNTER — Other Ambulatory Visit: Payer: Self-pay

## 2023-03-13 ENCOUNTER — Other Ambulatory Visit (HOSPITAL_BASED_OUTPATIENT_CLINIC_OR_DEPARTMENT_OTHER): Payer: Self-pay

## 2023-03-13 MED ORDER — HYDROCHLOROTHIAZIDE 12.5 MG PO TABS
6.2500 mg | ORAL_TABLET | Freq: Every day | ORAL | 1 refills | Status: DC
  Filled 2023-03-13: qty 45, 90d supply, fill #0
  Filled 2023-06-26: qty 45, 90d supply, fill #1

## 2023-03-20 ENCOUNTER — Encounter: Payer: 59 | Admitting: Family Medicine

## 2023-04-09 ENCOUNTER — Other Ambulatory Visit: Payer: Self-pay | Admitting: Family Medicine

## 2023-04-10 ENCOUNTER — Other Ambulatory Visit (HOSPITAL_BASED_OUTPATIENT_CLINIC_OR_DEPARTMENT_OTHER): Payer: Self-pay

## 2023-04-10 MED ORDER — AMPHETAMINE-DEXTROAMPHET ER 20 MG PO CP24
20.0000 mg | ORAL_CAPSULE | Freq: Every day | ORAL | 0 refills | Status: DC
  Filled 2023-04-10: qty 90, 90d supply, fill #0

## 2023-04-10 MED ORDER — AMPHETAMINE-DEXTROAMPHETAMINE 10 MG PO TABS
10.0000 mg | ORAL_TABLET | Freq: Every day | ORAL | 0 refills | Status: DC
Start: 2023-04-10 — End: 2023-07-24
  Filled 2023-04-10 – 2023-04-16 (×2): qty 90, 90d supply, fill #0

## 2023-04-10 MED ORDER — ESTRADIOL 1 MG PO TABS
ORAL_TABLET | ORAL | 1 refills | Status: DC
  Filled 2023-04-10: qty 108, 90d supply, fill #0
  Filled 2023-06-26: qty 108, 90d supply, fill #1

## 2023-04-10 NOTE — Telephone Encounter (Signed)
Patient is requesting a refill of the following medications: Requested Prescriptions   Pending Prescriptions Disp Refills   estradiol (ESTRACE) 1 MG tablet 108 tablet 1    Sig: Take 1 tablet by mouth daily for 4 days each week and take 1 and 1/2 tablets daily for 3 days each week.   amphetamine-dextroamphetamine (ADDERALL XR) 20 MG 24 hr capsule 90 capsule 0    Sig: Take 1 capsule (20 mg total) by mouth daily.   amphetamine-dextroamphetamine (ADDERALL) 10 MG tablet 90 tablet 0    Sig: Take 1 tablet by mouth daily in the afternoon for additional focus as needed    Date of patient request: 04/10/23 Last office visit: 10/25/22 Date of last refill: 01/08/23 Last refill amount: 90 Follow up time period per chart: ~june

## 2023-04-15 ENCOUNTER — Other Ambulatory Visit: Payer: Self-pay | Admitting: Family Medicine

## 2023-04-16 ENCOUNTER — Other Ambulatory Visit (HOSPITAL_BASED_OUTPATIENT_CLINIC_OR_DEPARTMENT_OTHER): Payer: Self-pay

## 2023-04-16 ENCOUNTER — Other Ambulatory Visit: Payer: Self-pay

## 2023-04-16 MED ORDER — OLMESARTAN MEDOXOMIL 20 MG PO TABS
30.0000 mg | ORAL_TABLET | Freq: Every day | ORAL | 1 refills | Status: DC
  Filled 2023-04-16 – 2023-06-26 (×2): qty 135, 90d supply, fill #0
  Filled 2023-09-30: qty 135, 90d supply, fill #1

## 2023-05-13 LAB — HELIX MOLECULAR SCREEN: Genetic Analysis Overall Interpretation: NEGATIVE

## 2023-06-04 DIAGNOSIS — D1801 Hemangioma of skin and subcutaneous tissue: Secondary | ICD-10-CM | POA: Diagnosis not present

## 2023-06-04 DIAGNOSIS — L821 Other seborrheic keratosis: Secondary | ICD-10-CM | POA: Diagnosis not present

## 2023-06-04 DIAGNOSIS — L814 Other melanin hyperpigmentation: Secondary | ICD-10-CM | POA: Diagnosis not present

## 2023-06-04 DIAGNOSIS — D2272 Melanocytic nevi of left lower limb, including hip: Secondary | ICD-10-CM | POA: Diagnosis not present

## 2023-06-04 DIAGNOSIS — L738 Other specified follicular disorders: Secondary | ICD-10-CM | POA: Diagnosis not present

## 2023-06-04 DIAGNOSIS — L603 Nail dystrophy: Secondary | ICD-10-CM | POA: Diagnosis not present

## 2023-06-04 DIAGNOSIS — L578 Other skin changes due to chronic exposure to nonionizing radiation: Secondary | ICD-10-CM | POA: Diagnosis not present

## 2023-06-18 DIAGNOSIS — H04123 Dry eye syndrome of bilateral lacrimal glands: Secondary | ICD-10-CM | POA: Diagnosis not present

## 2023-06-27 ENCOUNTER — Other Ambulatory Visit: Payer: Self-pay

## 2023-07-24 ENCOUNTER — Encounter: Payer: Self-pay | Admitting: Family Medicine

## 2023-07-24 ENCOUNTER — Other Ambulatory Visit: Payer: Self-pay | Admitting: Family Medicine

## 2023-07-25 ENCOUNTER — Other Ambulatory Visit (HOSPITAL_BASED_OUTPATIENT_CLINIC_OR_DEPARTMENT_OTHER): Payer: Self-pay

## 2023-07-25 MED ORDER — AMPHETAMINE-DEXTROAMPHET ER 20 MG PO CP24
20.0000 mg | ORAL_CAPSULE | Freq: Every day | ORAL | 0 refills | Status: DC
  Filled 2023-07-25: qty 90, 90d supply, fill #0

## 2023-07-25 MED ORDER — AMPHETAMINE-DEXTROAMPHETAMINE 10 MG PO TABS
10.0000 mg | ORAL_TABLET | Freq: Every day | ORAL | 0 refills | Status: DC
  Filled 2023-07-25: qty 90, 90d supply, fill #0

## 2023-08-08 ENCOUNTER — Encounter: Payer: Self-pay | Admitting: Family Medicine

## 2023-08-08 ENCOUNTER — Ambulatory Visit (INDEPENDENT_AMBULATORY_CARE_PROVIDER_SITE_OTHER): Payer: 59 | Admitting: Family Medicine

## 2023-08-08 ENCOUNTER — Other Ambulatory Visit (HOSPITAL_BASED_OUTPATIENT_CLINIC_OR_DEPARTMENT_OTHER): Payer: Self-pay

## 2023-08-08 VITALS — BP 138/78 | HR 68 | Temp 97.8°F | Ht 65.0 in | Wt 159.1 lb

## 2023-08-08 DIAGNOSIS — Z114 Encounter for screening for human immunodeficiency virus [HIV]: Secondary | ICD-10-CM | POA: Diagnosis not present

## 2023-08-08 DIAGNOSIS — Z1159 Encounter for screening for other viral diseases: Secondary | ICD-10-CM

## 2023-08-08 DIAGNOSIS — M8580 Other specified disorders of bone density and structure, unspecified site: Secondary | ICD-10-CM | POA: Diagnosis not present

## 2023-08-08 DIAGNOSIS — I10 Essential (primary) hypertension: Secondary | ICD-10-CM | POA: Diagnosis not present

## 2023-08-08 DIAGNOSIS — Z79890 Hormone replacement therapy (postmenopausal): Secondary | ICD-10-CM | POA: Diagnosis not present

## 2023-08-08 DIAGNOSIS — Z0000 Encounter for general adult medical examination without abnormal findings: Secondary | ICD-10-CM | POA: Diagnosis not present

## 2023-08-08 LAB — CBC WITH DIFFERENTIAL/PLATELET
Basophils Absolute: 0.1 10*3/uL (ref 0.0–0.1)
Basophils Relative: 1.1 % (ref 0.0–3.0)
Eosinophils Absolute: 0 10*3/uL (ref 0.0–0.7)
Eosinophils Relative: 0.5 % (ref 0.0–5.0)
HCT: 41.3 % (ref 36.0–46.0)
Hemoglobin: 13.5 g/dL (ref 12.0–15.0)
Lymphocytes Relative: 33.8 % (ref 12.0–46.0)
Lymphs Abs: 1.6 10*3/uL (ref 0.7–4.0)
MCHC: 32.6 g/dL (ref 30.0–36.0)
MCV: 91 fL (ref 78.0–100.0)
Monocytes Absolute: 0.5 10*3/uL (ref 0.1–1.0)
Monocytes Relative: 9.7 % (ref 3.0–12.0)
Neutro Abs: 2.6 10*3/uL (ref 1.4–7.7)
Neutrophils Relative %: 54.9 % (ref 43.0–77.0)
Platelets: 264 10*3/uL (ref 150.0–400.0)
RBC: 4.55 Mil/uL (ref 3.87–5.11)
RDW: 16.1 % — ABNORMAL HIGH (ref 11.5–15.5)
WBC: 4.7 10*3/uL (ref 4.0–10.5)

## 2023-08-08 LAB — HEPATIC FUNCTION PANEL
ALT: 16 U/L (ref 0–35)
AST: 16 U/L (ref 0–37)
Albumin: 4.3 g/dL (ref 3.5–5.2)
Alkaline Phosphatase: 74 U/L (ref 39–117)
Bilirubin, Direct: 0.1 mg/dL (ref 0.0–0.3)
Total Bilirubin: 0.5 mg/dL (ref 0.2–1.2)
Total Protein: 6.8 g/dL (ref 6.0–8.3)

## 2023-08-08 LAB — BASIC METABOLIC PANEL
BUN: 19 mg/dL (ref 6–23)
CO2: 28 meq/L (ref 19–32)
Calcium: 9.4 mg/dL (ref 8.4–10.5)
Chloride: 102 meq/L (ref 96–112)
Creatinine, Ser: 0.91 mg/dL (ref 0.40–1.20)
GFR: 69.31 mL/min (ref >60.00)
Glucose, Bld: 95 mg/dL (ref 70–99)
Potassium: 4 meq/L (ref 3.5–5.1)
Sodium: 138 meq/L (ref 135–145)

## 2023-08-08 LAB — LIPID PANEL
Cholesterol: 192 mg/dL (ref 0–200)
HDL: 59.7 mg/dL (ref >39.00)
LDL Cholesterol: 111 mg/dL — ABNORMAL HIGH (ref 0–99)
NonHDL: 132.49
Total CHOL/HDL Ratio: 3
Triglycerides: 105 mg/dL (ref 0.0–149.0)
VLDL: 21 mg/dL (ref 0.0–40.0)

## 2023-08-08 LAB — VITAMIN D 25 HYDROXY (VIT D DEFICIENCY, FRACTURES): VITD: 39.02 ng/mL (ref 30.00–100.00)

## 2023-08-08 LAB — TSH: TSH: 1.99 u[IU]/mL (ref 0.35–5.50)

## 2023-08-08 MED ORDER — ESTRADIOL 1 MG PO TABS
1.5000 mg | ORAL_TABLET | Freq: Every day | ORAL | 1 refills | Status: DC
  Filled 2023-08-08 – 2023-09-30 (×2): qty 135, 90d supply, fill #0
  Filled 2024-01-01: qty 135, 90d supply, fill #1

## 2023-08-08 NOTE — Progress Notes (Signed)
   Subjective:    Patient ID: Regina [name], female    DOB: 09-03-1965, 58 y.o.   MRN: [id number]  HPI CPE- UTD on mammo, colonoscopy, pap, Tdap, flu.  No concerns today  Patient Care Team    Relationship Specialty Notifications Start End  Sheliah Hatch, MD PCP - General Family Medicine  06/26/21     Health Maintenance  Topic Date Due   HIV Screening  Never done   Hepatitis C Screening  Never done   COVID-19 Vaccine (5 - 2024-25 season) 03/17/2023   MAMMOGRAM  09/11/2024   Colonoscopy  04/12/2026   Cervical Cancer Screening (HPV/Pap Cotest)  01/04/2027   DTaP/Tdap/Td (3 - Td or Tdap) 01/04/2032   INFLUENZA VACCINE  Completed   Zoster Vaccines- Shingrix  Completed   HPV VACCINES  Aged Out      Review of Systems Patient reports no vision/ hearing changes, adenopathy,fever, weight change,  persistant/recurrent hoarseness , swallowing issues, chest pain, palpitations, edema, persistant/recurrent cough, hemoptysis, dyspnea (rest/exertional/paroxysmal nocturnal), gastrointestinal bleeding (melena, rectal bleeding), abdominal pain, significant heartburn, bowel changes, GU symptoms (dysuria, hematuria, incontinence), Gyn symptoms (abnormal  bleeding, pain),  syncope, focal weakness, memory loss, numbness & tingling, skin/hair/nail changes, abnormal bruising or bleeding, anxiety, or depression.     Objective:   Physical Exam General Appearance:    Alert, cooperative, no distress, appears stated age  Head:    Normocephalic, without obvious abnormality, atraumatic  Eyes:    PERRL, conjunctiva/corneas clear, EOM's intact both eyes  Ears:    Normal TM's and external ear canals, both ears  Nose:   Nares normal, septum midline, mucosa normal, no drainage    or sinus tenderness  Throat:   Lips, mucosa, and tongue normal; teeth and gums normal  Neck:   Supple, symmetrical, trachea midline, no adenopathy;    Thyroid: no enlargement/tenderness/nodules  Back:     Symmetric, no  curvature, ROM normal, no CVA tenderness  Lungs:     Clear to auscultation bilaterally, respirations unlabored  Chest Wall:    No tenderness or deformity   Heart:    Regular rate and rhythm, S1 and S2 normal, no murmur, rub   or gallop  Breast Exam:    Deferred to mammo  Abdomen:     Soft, non-tender, bowel sounds active all four quadrants,    no masses, no organomegaly  Genitalia:    Deferred to GYN  Rectal:    Extremities:   Extremities normal, atraumatic, no cyanosis or edema  Pulses:   2+ and symmetric all extremities  Skin:   Skin color, texture, turgor normal, no rashes or lesions  Lymph nodes:   Cervical, supraclavicular, and axillary nodes normal  Neurologic:   CNII-XII intact, normal strength, sensation and reflexes    throughout          Assessment & Plan:

## 2023-08-08 NOTE — Assessment & Plan Note (Signed)
 Check Vit D level and replete prn.

## 2023-08-08 NOTE — Assessment & Plan Note (Signed)
Pt's PE WNL.  UTD on pap, mammo, colonoscopy, Tdap, flu.  Check labs.  Anticipatory guidance provided.  

## 2023-08-08 NOTE — Assessment & Plan Note (Signed)
Adequate control.  Check labs due to ARB use but no anticipated med changes.

## 2023-08-08 NOTE — Patient Instructions (Signed)
Follow up in 6 months to recheck blood pressure We'll notify your of your lab results and make any changes if needed Keep up the good work on healthy diet and regular exercise- you look great! Call with any questions or concerns Stay Safe!  Stay Healthy! Happy Birthday!!

## 2023-08-09 LAB — HEPATITIS C ANTIBODY: Hepatitis C Ab: NONREACTIVE

## 2023-08-09 LAB — HIV ANTIBODY (ROUTINE TESTING W REFLEX): HIV 1&2 Ab, 4th Generation: NONREACTIVE

## 2023-08-11 ENCOUNTER — Encounter: Payer: Self-pay | Admitting: Family Medicine

## 2023-08-12 ENCOUNTER — Telehealth: Payer: Self-pay

## 2023-08-12 NOTE — Telephone Encounter (Signed)
-----   Message from Neena Rhymes sent at 08/11/2023  5:24 PM EST ----- Labs look great!  No changes at this time

## 2023-08-13 NOTE — Telephone Encounter (Signed)
Called patient to discuss lab work, no answer, left a message for the patient to call back to discuss or review by MyChart if that is prefered.

## 2023-08-14 NOTE — Telephone Encounter (Signed)
Called patient to discuss lab work, no answer, left a message for the patient to call back to discuss or review by MyChart if that is prefered. And informed her we would be mailing a letter at this time

## 2023-08-14 NOTE — Telephone Encounter (Signed)
Letter sent.

## 2023-09-30 ENCOUNTER — Other Ambulatory Visit (HOSPITAL_BASED_OUTPATIENT_CLINIC_OR_DEPARTMENT_OTHER): Payer: Self-pay

## 2023-09-30 ENCOUNTER — Other Ambulatory Visit: Payer: Self-pay

## 2023-09-30 ENCOUNTER — Other Ambulatory Visit: Payer: Self-pay | Admitting: Family Medicine

## 2023-09-30 ENCOUNTER — Encounter: Payer: Self-pay | Admitting: Family Medicine

## 2023-09-30 DIAGNOSIS — Z79890 Hormone replacement therapy (postmenopausal): Secondary | ICD-10-CM

## 2023-09-30 DIAGNOSIS — N939 Abnormal uterine and vaginal bleeding, unspecified: Secondary | ICD-10-CM

## 2023-09-30 MED ORDER — AMPHETAMINE-DEXTROAMPHET ER 20 MG PO CP24
20.0000 mg | ORAL_CAPSULE | Freq: Every day | ORAL | 0 refills | Status: DC
  Filled 2023-09-30 – 2023-10-27 (×2): qty 90, 90d supply, fill #0

## 2023-09-30 MED ORDER — PROGESTERONE MICRONIZED 100 MG PO CAPS
100.0000 mg | ORAL_CAPSULE | Freq: Every day | ORAL | 0 refills | Status: DC
Start: 2023-09-30 — End: 2024-03-11
  Filled 2023-09-30 – 2023-12-02 (×3): qty 90, 90d supply, fill #0

## 2023-09-30 MED ORDER — PROGESTERONE MICRONIZED 100 MG PO CAPS
200.0000 mg | ORAL_CAPSULE | Freq: Every day | ORAL | 3 refills | Status: DC
  Filled 2023-09-30: qty 180, 90d supply, fill #0

## 2023-09-30 MED ORDER — HYDROCHLOROTHIAZIDE 12.5 MG PO TABS
6.2500 mg | ORAL_TABLET | Freq: Every day | ORAL | 1 refills | Status: DC
  Filled 2023-09-30: qty 45, 90d supply, fill #0
  Filled 2024-01-01: qty 45, 90d supply, fill #1

## 2023-09-30 NOTE — Telephone Encounter (Signed)
 [facility] VISIT   Patient agreed to [facility] visit and is aware that copayment and coinsurance may apply. Patient was treated using telemedicine according to accepted telemedicine protocols.  Subjective:   Patient complains of vaginal spotting  Patient Active Problem List   Diagnosis Date Noted   Overweight (BMI 25.0-29.9) 07/19/2022   Physical exam 01/03/2022   Stress fracture of left fibula 12/05/2021   Sprain of medial collateral ligament of left knee 08/16/2021   Degenerative tear of lateral meniscus of left knee 08/16/2021   Hormone replacement therapy (HRT) 06/28/2021   Arthritis of first MTP joint 11/17/2020   Hamstring tendinitis at origin 08/18/2020   Hyperlipidemia 09/23/2017   Osteopenia 09/23/2017   ADD (attention deficit disorder) 10/24/2012   HTN (hypertension) 01/28/2012   Social History   Tobacco Use   Smoking status: Former   Smokeless tobacco: Never   Tobacco comments:    quit 30 yrs ago   Substance Use Topics   Alcohol use: Yes    Alcohol/week: 2.0 standard drinks of alcohol    Types: 2 Glasses of wine per week    Comment: Rarely    Current Outpatient Medications:    progesterone (PROMETRIUM) 100 MG capsule, Take 1 capsule (100 mg total) by mouth daily. This is in addition to the 200mg  nightly, Disp: 90 capsule, Rfl: 0   amphetamine-dextroamphetamine (ADDERALL XR) 20 MG 24 hr capsule, Take 1 capsule (20 mg total) by mouth daily., Disp: 90 capsule, Rfl: 0   amphetamine-dextroamphetamine (ADDERALL) 10 MG tablet, Take 1 tablet by mouth daily in the afternoon for additional focus as needed, Disp: 90 tablet, Rfl: 0   Biotin 16109 MCG TABS, Take 1 tablet by mouth daily., Disp: , Rfl:    estradiol (ESTRACE) 1 MG tablet, Take 1.5 tablets (1.5 mg total) by mouth daily., Disp: 135 tablet, Rfl: 1   hydrochlorothiazide (HYDRODIURIL) 12.5 MG tablet, Take 0.5 tablets (6.25 mg total) by mouth daily., Disp: 45 tablet, Rfl: 1   Magnesium 500 MG CAPS, Take 1 capsule by mouth  daily., Disp: , Rfl:    olmesartan (BENICAR) 20 MG tablet, Take 1.5 tablets (30 mg total) by mouth daily., Disp: 135 tablet, Rfl: 1   Omega-3 Fatty Acids (FISH OIL) 300 MG CAPS, Take 1 capsule by mouth every other day., Disp: , Rfl:    progesterone (PROMETRIUM) 100 MG capsule, Take 2 capsules (200 mg total) by mouth daily., Disp: 180 capsule, Rfl: 3   TURMERIC PO, Take 1,500 mg by mouth daily., Disp: , Rfl:    VITAMIN D, ERGOCALCIFEROL, PO, Take 2,000 mg by mouth daily. , Disp: , Rfl:   Current Facility-Administered Medications:    0.9 %  sodium chloride infusion, 500 mL, Intravenous, Continuous, Iva Boop, MD  Allergies  Allergen Reactions   Lisinopril Nausea And Vomiting    Assessment and Plan:   Diagnosis: vaginal spotting. Please see myChart communication and orders below.   No orders of the defined types were placed in this encounter.  Meds ordered this encounter  Medications   amphetamine-dextroamphetamine (ADDERALL XR) 20 MG 24 hr capsule    Sig: Take 1 capsule (20 mg total) by mouth daily.    Dispense:  90 capsule    Refill:  0   progesterone (PROMETRIUM) 100 MG capsule    Sig: Take 1 capsule (100 mg total) by mouth daily. This is in addition to the 200mg  nightly    Dispense:  90 capsule    Refill:  0    Natalia Leatherwood  Beverely Low, MD 09/30/2023  A total of 12 minutes were spent by me to personally review the patient-generated inquiry, review patient records and data pertinent to assessment of the patient's problem, develop a management plan including generation of prescriptions and/or orders, and on subsequent communication with the patient through secure the MyChart portal service.   There is no separately reported E/M service related to this service in the past 7 days nor does the patient have an upcoming soonest available appointment for this issue. This work was completed in less than 7 days.   The patient consented to this service today (see patient agreement prior to  ongoing communication). Patient counseled regarding the need for in-person exam for certain conditions and was advised to call the office if any changing or worsening symptoms occur.   The codes to be used for the E/M service are: []   99421 for 5-10 minutes of time spent on the inquiry. [x]   [id number] for 11-20 minutes. []   [id number] for 21+ minutes.

## 2023-09-30 NOTE — Telephone Encounter (Signed)
 Patient notes since going up on estradiol she has had some vaginal spotting, asking for a refill with 300 daily   Please advise

## 2023-10-01 ENCOUNTER — Other Ambulatory Visit (HOSPITAL_BASED_OUTPATIENT_CLINIC_OR_DEPARTMENT_OTHER): Payer: Self-pay

## 2023-10-28 ENCOUNTER — Other Ambulatory Visit (HOSPITAL_BASED_OUTPATIENT_CLINIC_OR_DEPARTMENT_OTHER): Payer: Self-pay

## 2023-11-05 DIAGNOSIS — F4320 Adjustment disorder, unspecified: Secondary | ICD-10-CM | POA: Diagnosis not present

## 2023-11-08 ENCOUNTER — Other Ambulatory Visit (HOSPITAL_BASED_OUTPATIENT_CLINIC_OR_DEPARTMENT_OTHER): Payer: Self-pay

## 2023-11-08 ENCOUNTER — Other Ambulatory Visit: Payer: Self-pay | Admitting: Family Medicine

## 2023-11-08 MED ORDER — AMPHETAMINE-DEXTROAMPHETAMINE 10 MG PO TABS
10.0000 mg | ORAL_TABLET | Freq: Every day | ORAL | 0 refills | Status: DC
  Filled 2023-11-08: qty 90, 90d supply, fill #0

## 2023-11-19 DIAGNOSIS — H524 Presbyopia: Secondary | ICD-10-CM | POA: Diagnosis not present

## 2023-11-21 ENCOUNTER — Ambulatory Visit: Admitting: Physical Therapy

## 2023-11-21 ENCOUNTER — Other Ambulatory Visit: Payer: Self-pay | Admitting: Family Medicine

## 2023-11-21 ENCOUNTER — Encounter: Payer: Self-pay | Admitting: Physical Therapy

## 2023-11-21 DIAGNOSIS — M542 Cervicalgia: Secondary | ICD-10-CM | POA: Diagnosis not present

## 2023-11-21 NOTE — Patient Instructions (Signed)

## 2023-11-21 NOTE — Therapy (Signed)
 OUTPATIENT PHYSICAL THERAPY UPPER EXTREMITY EVALUATION   Patient Name: Regina [name] MRN: [id number] DOB:Nov 13, 1965, 58 y.o., female Today's Date: 11/21/2023  END OF SESSION:  PT End of Session - 11/21/23 1257     Visit Number 1    Number of Visits 16    Date for PT Re-Evaluation 01/16/24    Authorization Type [facility]/[employer]/save    PT Start Time 1215    PT Stop Time 1250    PT Time Calculation (min) 35 min    Activity Tolerance Patient tolerated treatment well    Behavior During Therapy [facility] for tasks assessed/performed             Past Medical History:  Diagnosis Date   Hormone replacement therapy (HRT)    Hypertension    Migraines    Hormonal   Osteoarthritis of neck    Scoliosis    Past Surgical History:  Procedure Laterality Date   LIPOMA EXCISION Right 1999   shoulder   Patient Active Problem List   Diagnosis Date Noted   Overweight (BMI 25.0-29.9) 07/19/2022   Physical exam 01/03/2022   Stress fracture of left fibula 12/05/2021   Sprain of medial collateral ligament of left knee 08/16/2021   Degenerative tear of lateral meniscus of left knee 08/16/2021   Hormone replacement therapy (HRT) 06/28/2021   Arthritis of first MTP joint 11/17/2020   Hamstring tendinitis at origin 08/18/2020   Hyperlipidemia 09/23/2017   Osteopenia 09/23/2017   ADD (attention deficit disorder) 10/24/2012   HTN (hypertension) 01/28/2012    PCP: Laymon Priest  REFERRING PROVIDER: Glenetta Lane  REFERRING DIAG: Cervicalgia  THERAPY DIAG:  Cervicalgia  Rationale for Evaluation and Treatment: Rehabilitation  ONSET DATE:   SUBJECTIVE:                                                                                                                                                                                      SUBJECTIVE STATEMENT: Pt with increased pain in L side of neck. States increased pain with lateral rotation, as well as palpation of UT and L sided neck  musculature. States "tightness" feeling in neck. She has been able to continue exercise for postural strengthening and cycling.   Hand dominance: Right  PERTINENT HISTORY:   PAIN:  Are you having pain? Yes: NPRS scale: 5/10 Pain location: L side of neck Pain description: tight, sore Aggravating factors: increased activity, exercise, work duties.  Relieving factors: none stated   PRECAUTIONS: None  RED FLAGS: None   WEIGHT BEARING RESTRICTIONS: No  FALLS:  Has patient fallen in last 6 months? No   PLOF: Independent  PATIENT GOALS: Decreased pain/muscle tension in  neck.   NEXT MD VISIT:   OBJECTIVE:   DIAGNOSTIC FINDINGS:    PATIENT SURVEYS :    COGNITION: Overall cognitive status: Within functional limits for tasks assessed     SENSATION: WFL  POSTURE: Mild asymmetry in t-spine from scoliosis   UPPER EXTREMITY ROM:  Shoulders: WFL Neck: WFL  mild soreness with L and R end range rotation.   UPPER EXTREMITY MMT:  MMT Right eval Left eval  Shoulder flexion 4+ 4+  Shoulder extension    Shoulder abduction 4+ 4+  Shoulder adduction    Shoulder internal rotation    Shoulder external rotation    Middle trapezius    Lower trapezius    Elbow flexion    Elbow extension    Wrist flexion    Wrist extension    Wrist ulnar deviation    Wrist radial deviation    Wrist pronation    Wrist supination    Grip strength (lbs)    (Blank rows = not tested)  SHOULDER SPECIAL TESTS:   JOINT MOBILITY TESTING:  Mild hypomobility for mid/lower c-spine  PALPATION:  Tightness and trigger points in L UT, tightness into scalenes . Minimal tenderness in paraspinals or sub occipitals. L lateral recess tender in mid and upper c-spine.     TODAY'S TREATMENT:                                                                                                                                         DATE:  Eval:  Therapeutic  Exercise: Aerobic: Supine: Seated: Standing: Stretches:  Neuromuscular Re-education: Manual Therapy: Cervical PA s, side glides, L UT and levator stretches, Manual distraction;  Therapeutic Activity: Self Care: Modalities: moist heat pack x 10 min at end of session.  Trigger Point Dry Needling  Initial Treatment: Pt instructed on Dry Needling rational, procedures, and possible side effects. Pt instructed to expect mild to moderate muscle soreness later in the day and/or into the next day.  Pt instructed in methods to reduce muscle soreness. Pt instructed to continue prescribed HEP. Because Dry Needling was performed over or adjacent to a lung field, pt was educated on S/S of pneumothorax and to seek immediate medical attention should they occur.  Patient was educated on signs and symptoms of infection and other risk factors and advised to seek medical attention should they occur.  Patient verbalized understanding of these instructions and education.   Patient Verbal Consent Given: Yes Education Handout Provided: Yes Muscles Treated: L UT and levator Electrical Stimulation Performed: No Treatment Response/Outcome: palpable increase in muscle length, Twitch response.     PATIENT EDUCATION:  Education details: PT POC, Exam findings, HEP Person educated: Patient Education method: Explanation, Demonstration, Tactile cues, Verbal cues, and Handouts Education comprehension: verbalized understanding, returned demonstration, verbal cues required, tactile cues required, and needs further education   HOME EXERCISE PROGRAM: Seated UT stretch, seated cervical  rotation  - review for HEP,   ASSESSMENT:  CLINICAL IMPRESSION: Eval:  Patient presents with primary complaint of pain and tightness in L cervical region. She has increased muscle tension in Cervical musculature, and increased pain with rotation. She has tenderness to palpate L UT and L lateral paraspinals. She also has scoliosis  of t-spine, and will benefit from education on cervical and thoracic mobility and postural strengthening. We discussed improving ergonomics for work set up for neutral head position. Pt with decreased ability for full functional activities, reaching, lifting, carrying, and IADLs. Pt to benefit from skilled PT to improve deficits and pain.    OBJECTIVE IMPAIRMENTS: decreased activity tolerance, decreased ROM, decreased strength, increased muscle spasms, improper body mechanics, and pain.   ACTIVITY LIMITATIONS: carrying, lifting, reach over head, hygiene/grooming, and locomotion level  PARTICIPATION LIMITATIONS: cleaning, laundry, driving, shopping, community activity, occupation, and yard work  PERSONAL FACTORS: Time since onset of injury/illness/exacerbation are also affecting patient's functional outcome.   REHAB POTENTIAL: Good  CLINICAL DECISION MAKING: Stable/uncomplicated  EVALUATION COMPLEXITY: Low  GOALS: Goals reviewed with patient? Yes   SHORT TERM GOALS: Target date: 12/12/2023  Pt to be intendment with initial HEP  Goal status: INITIAL  2. Pt to report decreased muscle tension and pain to 3/10     LONG TERM GOALS: Target date: 01/16/2024   Pt to be independent with final HEP  Goal status: INITIAL  2.  Pt to demo improved ROM to be [facility] and pain free, to improve ability for ADLs.   Goal status: INITIAL  3.  Pt to report improved pain in L side of neck, to 0-2/10 with activity, exercise and work duties.   Goal status: INITIAL   PLAN: PT FREQUENCY: 1-2x/week  PT DURATION: 8 weeks  PLANNED INTERVENTIONS: Therapeutic exercises, Therapeutic activity, Neuromuscular re-education, Patient/Family education, Self Care, Joint mobilization, Joint manipulation, Stair training, DME instructions, Aquatic Therapy, Dry Needling, Electrical stimulation, Cryotherapy, Moist heat, Taping, Ultrasound, Ionotophoresis 4mg /ml Dexamethasone, Manual therapy,  Vasopneumatic device,  Traction, Spinal manipulation, Spinal mobilization,   PLAN FOR NEXT SESSION: mobilization/stiffness, t-spine mobility, repeated rotation motions, SA press for shoulder blades, L UT tightness, L facet tenderness. Re-check sub occipitals., desk ergonomics, neck/shoulder mobility for work day.    Terrilee Few, PT, DPT 3:30 PM  11/21/23

## 2023-11-21 NOTE — Progress Notes (Signed)
 Need referral to pt for L neck pain

## 2023-11-26 ENCOUNTER — Ambulatory Visit (INDEPENDENT_AMBULATORY_CARE_PROVIDER_SITE_OTHER): Admitting: Physical Therapy

## 2023-11-26 ENCOUNTER — Encounter: Payer: Self-pay | Admitting: Physical Therapy

## 2023-11-26 DIAGNOSIS — M6281 Muscle weakness (generalized): Secondary | ICD-10-CM

## 2023-11-26 DIAGNOSIS — M542 Cervicalgia: Secondary | ICD-10-CM

## 2023-11-26 DIAGNOSIS — F4320 Adjustment disorder, unspecified: Secondary | ICD-10-CM | POA: Diagnosis not present

## 2023-11-26 NOTE — Therapy (Signed)
 OUTPATIENT PHYSICAL THERAPY UPPER EXTREMITY TREATMENT   Patient Name: Regina [name] MRN: [id number] DOB:[birth year], 58 y.o., female Today's Date: 11/26/2023  END OF SESSION:  PT End of Session - 11/26/23 1216     Visit Number 2    Number of Visits 16    Date for PT Re-Evaluation 01/16/24    Authorization Type [facility]/[employer]/save    PT Start Time 1216    PT Stop Time 1257    PT Time Calculation (min) 41 min    Activity Tolerance Patient tolerated treatment well    Behavior During Therapy [facility] for tasks assessed/performed              Past Medical History:  Diagnosis Date   Hormone replacement therapy (HRT)    Hypertension    Migraines    Hormonal   Osteoarthritis of neck    Scoliosis    Past Surgical History:  Procedure Laterality Date   LIPOMA EXCISION Right 1999   shoulder   Patient Active Problem List   Diagnosis Date Noted   Overweight (BMI 25.0-29.9) 07/19/2022   Physical exam 01/03/2022   Stress fracture of left fibula 12/05/2021   Sprain of medial collateral ligament of left knee 08/16/2021   Degenerative tear of lateral meniscus of left knee 08/16/2021   Hormone replacement therapy (HRT) 06/28/2021   Arthritis of first MTP joint 11/17/2020   Hamstring tendinitis at origin 08/18/2020   Hyperlipidemia 09/23/2017   Osteopenia 09/23/2017   ADD (attention deficit disorder) 10/24/2012   HTN (hypertension) 01/28/2012    PCP: Laymon Priest  REFERRING PROVIDER: Glenetta Lane  REFERRING DIAG: Cervicalgia  THERAPY DIAG:  Cervicalgia  Muscle weakness (generalized)  Rationale for Evaluation and Treatment: Rehabilitation  ONSET DATE:   SUBJECTIVE:                                                                                                                                                                                      SUBJECTIVE STATEMENT: 11/26/2023 States that her upper neck is still bothering her on the left side. States that the DN on the  UT helps. EVAL: Pt with increased pain in L side of neck. States increased pain with lateral rotation, as well as palpation of UT and L sided neck musculature. States "tightness" feeling in neck. She has been able to continue exercise for postural strengthening and cycling.   Hand dominance: Right  PERTINENT HISTORY:   PAIN:  Are you having pain? Yes: NPRS scale: 5/10 Pain location: L side of neck Pain description: tight, sore Aggravating factors: increased activity, exercise, work duties.  Relieving factors: none stated   PRECAUTIONS: None  RED FLAGS: None  WEIGHT BEARING RESTRICTIONS: No  FALLS:  Has patient fallen in last 6 months? No   PLOF: Independent  PATIENT GOALS: Decreased pain/muscle tension in neck.   NEXT MD VISIT:   OBJECTIVE:   DIAGNOSTIC FINDINGS:    PATIENT SURVEYS :    COGNITION: Overall cognitive status: Within functional limits for tasks assessed     SENSATION: WFL  POSTURE: Mild asymmetry in t-spine from scoliosis   UPPER EXTREMITY ROM:  Shoulders: WFL Neck: WFL  mild soreness with L and R end range rotation.   UPPER EXTREMITY MMT:  MMT Right eval Left eval  Shoulder flexion 4+ 4+  Shoulder extension    Shoulder abduction 4+ 4+  Shoulder adduction    Shoulder internal rotation    Shoulder external rotation    Middle trapezius    Lower trapezius    Elbow flexion    Elbow extension    Wrist flexion    Wrist extension    Wrist ulnar deviation    Wrist radial deviation    Wrist pronation    Wrist supination    Grip strength (lbs)    (Blank rows = not tested)  SHOULDER SPECIAL TESTS:   JOINT MOBILITY TESTING:  Mild hypomobility for mid/lower c-spine  PALPATION:  Tightness and trigger points in L UT, tightness into scalenes . Minimal tenderness in paraspinals or sub occipitals. L lateral recess tender in mid and upper c-spine.     TODAY'S TREATMENT:                                                                                                                                          DATE:  11/26/2023  Therapeutic Exercise: Review of wall exercises Supine: Laying on foam roller for thoracic extension compensates in lower thoracic stopped Prone: Belly breathing over pillow Limited motion along lumbar thoracic region compensates for shoulders and back stopped Standing: Stretches:  Neuromuscular Re-education:Cat cow compensates with lower thoracic-focused on isolated segmental motion from neck to lower back tolerated moderately well, long exhale breathing and lateral costal breathing with serratus anterior reach.  Lateral costal breathing very challenging for patient but improved with tactile cues Manual Therapy: Thoracic and rib mobilizations PA grade II, III tolerated moderately well, STM to thoracic paraspinals as well as rhomboids, STM to anterior cervical musculature left greater than right Therapeutic Activity: Self Care:       PATIENT EDUCATION:  Education details: HEP Person educated: Patient Education method: Programmer, multimedia, Demonstration, Tactile cues, Verbal cues, and Handouts Education comprehension: verbalized understanding, returned demonstration, verbal cues required, tactile cues required, and needs further education   HOME EXERCISE PROGRAM: Seated UT stretch, seated cervical rotation  - review for HEP,  Lateral costal breathing with arm reach towards ceiling, long exhale breathing  ASSESSMENT:  CLINICAL IMPRESSION: 11/26/2023 Patient presents to physical therapy with continued complaints of neck pain.  Patient primarily flexes at at mid  thoracic region and extends at lower thoracic region where pain and tightness presents.  Tolerated session well but continues to compensate in these regions.  Educated patient in posture and positioning and how improved core activation and posture will help with neck position.  No increase in symptoms noted end of session.  Eval:  Patient presents  with primary complaint of pain and tightness in L cervical region. She has increased muscle tension in Cervical musculature, and increased pain with rotation. She has tenderness to palpate L UT and L lateral paraspinals. She also has scoliosis of t-spine, and will benefit from education on cervical and thoracic mobility and postural strengthening. We discussed improving ergonomics for work set up for neutral head position. Pt with decreased ability for full functional activities, reaching, lifting, carrying, and IADLs. Pt to benefit from skilled PT to improve deficits and pain.    OBJECTIVE IMPAIRMENTS: decreased activity tolerance, decreased ROM, decreased strength, increased muscle spasms, improper body mechanics, and pain.   ACTIVITY LIMITATIONS: carrying, lifting, reach over head, hygiene/grooming, and locomotion level  PARTICIPATION LIMITATIONS: cleaning, laundry, driving, shopping, community activity, occupation, and yard work  PERSONAL FACTORS: Time since onset of injury/illness/exacerbation are also affecting patient's functional outcome.   REHAB POTENTIAL: Good  CLINICAL DECISION MAKING: Stable/uncomplicated  EVALUATION COMPLEXITY: Low  GOALS: Goals reviewed with patient? Yes   SHORT TERM GOALS: Target date: 12/12/2023  Pt to be intendment with initial HEP  Goal status: INITIAL  2. Pt to report decreased muscle tension and pain to 3/10     LONG TERM GOALS: Target date: 01/16/2024   Pt to be independent with final HEP  Goal status: INITIAL  2.  Pt to demo improved ROM to be [facility] and pain free, to improve ability for ADLs.   Goal status: INITIAL  3.  Pt to report improved pain in L side of neck, to 0-2/10 with activity, exercise and work duties.   Goal status: INITIAL   PLAN: PT FREQUENCY: 1-2x/week  PT DURATION: 8 weeks  PLANNED INTERVENTIONS: Therapeutic exercises, Therapeutic activity, Neuromuscular re-education, Patient/Family education, Self Care, Joint  mobilization, Joint manipulation, Stair training, DME instructions, Aquatic Therapy, Dry Needling, Electrical stimulation, Cryotherapy, Moist heat, Taping, Ultrasound, Ionotophoresis 4mg /ml Dexamethasone, Manual therapy,  Vasopneumatic device, Traction, Spinal manipulation, Spinal mobilization,   PLAN FOR NEXT SESSION: mobilization/stiffness, t-spine mobility, repeated rotation motions, SA press for shoulder blades, L UT tightness, L facet tenderness. Re-check sub occipitals., desk ergonomics, neck/shoulder mobility for work day.    5:01 PM, 11/26/23 Tabitha Ewings, DPT Physical Therapy with [facility]

## 2023-12-02 ENCOUNTER — Encounter: Payer: Self-pay | Admitting: Family Medicine

## 2023-12-02 ENCOUNTER — Other Ambulatory Visit (HOSPITAL_BASED_OUTPATIENT_CLINIC_OR_DEPARTMENT_OTHER): Payer: Self-pay

## 2023-12-02 DIAGNOSIS — Z79890 Hormone replacement therapy (postmenopausal): Secondary | ICD-10-CM

## 2023-12-03 ENCOUNTER — Other Ambulatory Visit (HOSPITAL_BASED_OUTPATIENT_CLINIC_OR_DEPARTMENT_OTHER): Payer: Self-pay

## 2023-12-03 ENCOUNTER — Encounter: Payer: Self-pay | Admitting: Physical Therapy

## 2023-12-03 ENCOUNTER — Ambulatory Visit (INDEPENDENT_AMBULATORY_CARE_PROVIDER_SITE_OTHER): Admitting: Physical Therapy

## 2023-12-03 DIAGNOSIS — M542 Cervicalgia: Secondary | ICD-10-CM | POA: Diagnosis not present

## 2023-12-03 DIAGNOSIS — M6281 Muscle weakness (generalized): Secondary | ICD-10-CM

## 2023-12-03 MED ORDER — PROGESTERONE MICRONIZED 100 MG PO CAPS
300.0000 mg | ORAL_CAPSULE | Freq: Every day | ORAL | 3 refills | Status: DC
  Filled 2023-12-03: qty 100, 33d supply, fill #0
  Filled 2024-01-01: qty 270, 90d supply, fill #1
  Filled 2024-01-07: qty 100, 33d supply, fill #1
  Filled 2024-01-07: qty 6, 2d supply, fill #1
  Filled 2024-01-07: qty 94, 31d supply, fill #1
  Filled 2024-02-09: qty 100, 33d supply, fill #2
  Filled 2024-03-10: qty 100, 33d supply, fill #3

## 2023-12-03 NOTE — Therapy (Signed)
 OUTPATIENT PHYSICAL THERAPY UPPER EXTREMITY TREATMENT   Patient Name: Regina [name] MRN: [id number] DOB:03/30/1966, 58 y.o., female Today's Date: 12/03/2023  END OF SESSION:  PT End of Session - 12/03/23 1300     Visit Number 3    Number of Visits 16    Date for PT Re-Evaluation 01/16/24    Authorization Type [facility]/[employer]etna/save    PT Start Time 1302    PT Stop Time 1340    PT Time Calculation (min) 38 min    Activity Tolerance Patient tolerated treatment well    Behavior During Therapy WFL for tasks assessed/performed              Past Medical History:  Diagnosis Date   Hormone replacement therapy (HRT)    Hypertension    Migraines    Hormonal   Osteoarthritis of neck    Scoliosis    Past Surgical History:  Procedure Laterality Date   LIPOMA EXCISION Right 1999   shoulder   Patient Active Problem List   Diagnosis Date Noted   Overweight (BMI 25.0-29.9) 07/19/2022   Physical exam 01/03/2022   Stress fracture of left fibula 12/05/2021   Sprain of medial collateral ligament of left knee 08/16/2021   Degenerative tear of lateral meniscus of left knee 08/16/2021   Hormone replacement therapy (HRT) 06/28/2021   Arthritis of first MTP joint 11/17/2020   Hamstring tendinitis at origin 08/18/2020   Hyperlipidemia 09/23/2017   Osteopenia 09/23/2017   ADD (attention deficit disorder) 10/24/2012   HTN (hypertension) 01/28/2012    PCP: Laymon Priest  REFERRING PROVIDER: Glenetta Lane  REFERRING DIAG: Cervicalgia  THERAPY DIAG:  Cervicalgia  Muscle weakness (generalized)  Rationale for Evaluation and Treatment: Rehabilitation  ONSET DATE:   SUBJECTIVE:                                                                                                                                                                                      SUBJECTIVE STATEMENT: 12/03/2023 States that her back was hurting today. States she did her exercises Saturday. States that  last night she did her elliptical. States pain is at or higher than bra line.   Aaron Aas EVAL: Pt with increased pain in L side of neck. States increased pain with lateral rotation, as well as palpation of UT and L sided neck musculature. States "tightness" feeling in neck. She has been able to continue exercise for postural strengthening and cycling.   Hand dominance: Right  PERTINENT HISTORY:   PAIN:  Are you having pain? Yes: NPRS scale: 3/10 Pain location: mid back Pain description: tight, sore Aggravating factors: increased activity, exercise, work duties.  Relieving factors:  none stated   PRECAUTIONS: None  RED FLAGS: None   WEIGHT BEARING RESTRICTIONS: No  FALLS:  Has patient fallen in last 6 months? No   PLOF: Independent  PATIENT GOALS: Decreased pain/muscle tension in neck.   NEXT MD VISIT:   OBJECTIVE:   DIAGNOSTIC FINDINGS:    PATIENT SURVEYS :    COGNITION: Overall cognitive status: Within functional limits for tasks assessed     SENSATION: WFL  POSTURE: Mild asymmetry in t-spine from scoliosis   UPPER EXTREMITY ROM:  Shoulders: WFL Neck: WFL  mild soreness with L and R end range rotation.   UPPER EXTREMITY MMT:  MMT Right eval Left eval  Shoulder flexion 4+ 4+  Shoulder extension    Shoulder abduction 4+ 4+  Shoulder adduction    Shoulder internal rotation    Shoulder external rotation    Middle trapezius    Lower trapezius    Elbow flexion    Elbow extension    Wrist flexion    Wrist extension    Wrist ulnar deviation    Wrist radial deviation    Wrist pronation    Wrist supination    Grip strength (lbs)    (Blank rows = not tested)  SHOULDER SPECIAL TESTS:   JOINT MOBILITY TESTING:  Mild hypomobility for mid/lower c-spine  PALPATION:  Tightness and trigger points in L UT, tightness into scalenes . Minimal tenderness in paraspinals or sub occipitals. L lateral recess tender in mid and upper c-spine.     TODAY'S  TREATMENT:                                                                                                                                         DATE:  12/03/2023  Therapeutic Exercise:  Reviewed HEP, educated on DN/KT tape, reviewed different exercises for posterior chain strengthening Supine: Laying on towel roll for thoracic extension across back  Prone:   Standing: Stretches:  Neuromuscular Re-education:  Manual Therapy: Thoracic and rib mobilizations PA grade II, III tolerated moderately well, STM to bilateral thoracic paraspinals as well as rhomboids   Therapeutic Activity: Self Care:       PATIENT EDUCATION:  Education details: HEP, on DN and KT tape  Person educated: Patient Education method: Programmer, multimedia, Demonstration, Tactile cues, Verbal cues, and Handouts Education comprehension: verbalized understanding, returned demonstration, verbal cues required, tactile cues required, and needs further education   HOME EXERCISE PROGRAM: Seated UT stretch, seated cervical rotation  - review for HEP,  Lateral costal breathing with arm reach towards ceiling, long exhale breathing  ASSESSMENT:  CLINICAL IMPRESSION: 12/03/2023 Focused on manual interventions. Tolerated well with reduced symptoms. Continues to complain of mid thoracic pain. Discussed risks benefits of needling in this area, no needling performed on this date but cupping and manual interventions performed. Tolerated well but continued to have pain along active trigger points. Will continue with current POC  as tolerated.   Eval:  Patient presents with primary complaint of pain and tightness in L cervical region. She has increased muscle tension in Cervical musculature, and increased pain with rotation. She has tenderness to palpate L UT and L lateral paraspinals. She also has scoliosis of t-spine, and will benefit from education on cervical and thoracic mobility and postural strengthening. We discussed improving  ergonomics for work set up for neutral head position. Pt with decreased ability for full functional activities, reaching, lifting, carrying, and IADLs. Pt to benefit from skilled PT to improve deficits and pain.    OBJECTIVE IMPAIRMENTS: decreased activity tolerance, decreased ROM, decreased strength, increased muscle spasms, improper body mechanics, and pain.   ACTIVITY LIMITATIONS: carrying, lifting, reach over head, hygiene/grooming, and locomotion level  PARTICIPATION LIMITATIONS: cleaning, laundry, driving, shopping, community activity, occupation, and yard work  PERSONAL FACTORS: Time since onset of injury/illness/exacerbation are also affecting patient's functional outcome.   REHAB POTENTIAL: Good  CLINICAL DECISION MAKING: Stable/uncomplicated  EVALUATION COMPLEXITY: Low  GOALS: Goals reviewed with patient? Yes   SHORT TERM GOALS: Target date: 12/12/2023  Pt to be intendment with initial HEP  Goal status: INITIAL  2. Pt to report decreased muscle tension and pain to 3/10     LONG TERM GOALS: Target date: 01/16/2024   Pt to be independent with final HEP  Goal status: INITIAL  2.  Pt to demo improved ROM to be [facility] and pain free, to improve ability for ADLs.   Goal status: INITIAL  3.  Pt to report improved pain in L side of neck, to 0-2/10 with activity, exercise and work duties.   Goal status: INITIAL   PLAN: PT FREQUENCY: 1-2x/week  PT DURATION: 8 weeks  PLANNED INTERVENTIONS: Therapeutic exercises, Therapeutic activity, Neuromuscular re-education, Patient/Family education, Self Care, Joint mobilization, Joint manipulation, Stair training, DME instructions, Aquatic Therapy, Dry Needling, Electrical stimulation, Cryotherapy, Moist heat, Taping, Ultrasound, Ionotophoresis 4mg /ml Dexamethasone, Manual therapy,  Vasopneumatic device, Traction, Spinal manipulation, Spinal mobilization,   PLAN FOR NEXT SESSION: DN thoracic paraspinals - try KT tape post needling   mobilization/stiffness, t-spine mobility, repeated rotation motions, SA press for shoulder blades, L UT tightness, L facet tenderness. Re-check sub occipitals., desk ergonomics, neck/shoulder mobility for work day.    2:58 PM, 12/03/23 Tabitha Ewings, DPT Physical Therapy with [facility]

## 2023-12-03 NOTE — Telephone Encounter (Signed)
 Requested Prescriptions   Pending Prescriptions Disp Refills   progesterone  (PROMETRIUM ) 100 MG capsule 180 capsule 3    Sig: Take 2 capsules (200 mg total) by mouth daily.     Date of patient request: 12/03/2023 Last office visit: 08/08/2023 Upcoming visit: 02/06/2024 Date of last refill: 09/30/2023 Last refill amount: 180   Patient notes she takes 3 nightly please advise

## 2023-12-04 ENCOUNTER — Other Ambulatory Visit (HOSPITAL_COMMUNITY): Payer: Self-pay

## 2023-12-04 ENCOUNTER — Other Ambulatory Visit (HOSPITAL_BASED_OUTPATIENT_CLINIC_OR_DEPARTMENT_OTHER): Payer: Self-pay

## 2023-12-04 ENCOUNTER — Other Ambulatory Visit: Payer: Self-pay

## 2023-12-10 ENCOUNTER — Ambulatory Visit: Admitting: Physical Therapy

## 2023-12-10 DIAGNOSIS — F4320 Adjustment disorder, unspecified: Secondary | ICD-10-CM | POA: Diagnosis not present

## 2023-12-10 DIAGNOSIS — M6281 Muscle weakness (generalized): Secondary | ICD-10-CM | POA: Diagnosis not present

## 2023-12-10 DIAGNOSIS — M542 Cervicalgia: Secondary | ICD-10-CM | POA: Diagnosis not present

## 2023-12-13 ENCOUNTER — Encounter: Payer: Self-pay | Admitting: Physical Therapy

## 2023-12-13 NOTE — Therapy (Signed)
 OUTPATIENT PHYSICAL THERAPY UPPER EXTREMITY TREATMENT   Patient Name: Regina [name] MRN: [id number] DOB:09/19/1965, 58 y.o., female Today's Date: 12/10/2023  END OF SESSION:  PT End of Session - 12/13/23 2128     Visit Number 4    Number of Visits 16    Date for PT Re-Evaluation 01/16/24    Authorization Type [facility]/[employer]/save    PT Start Time 1303    PT Stop Time 1344    PT Time Calculation (min) 41 min    Activity Tolerance Patient tolerated treatment well    Behavior During Therapy [facility] for tasks assessed/performed              Past Medical History:  Diagnosis Date   Hormone replacement therapy (HRT)    Hypertension    Migraines    Hormonal   Osteoarthritis of neck    Scoliosis    Past Surgical History:  Procedure Laterality Date   LIPOMA EXCISION Right 1999   shoulder   Patient Active Problem List   Diagnosis Date Noted   Overweight (BMI 25.0-29.9) 07/19/2022   Physical exam 01/03/2022   Stress fracture of left fibula 12/05/2021   Sprain of medial collateral ligament of left knee 08/16/2021   Degenerative tear of lateral meniscus of left knee 08/16/2021   Hormone replacement therapy (HRT) 06/28/2021   Arthritis of first MTP joint 11/17/2020   Hamstring tendinitis at origin 08/18/2020   Hyperlipidemia 09/23/2017   Osteopenia 09/23/2017   ADD (attention deficit disorder) 10/24/2012   HTN (hypertension) 01/28/2012    PCP: Laymon Priest  REFERRING PROVIDER: Glenetta Lane  REFERRING DIAG: Cervicalgia  THERAPY DIAG:  Cervicalgia  Muscle weakness (generalized)  Rationale for Evaluation and Treatment: Rehabilitation  ONSET DATE:   SUBJECTIVE:                                                                                                                                                                                      SUBJECTIVE STATEMENT: 12/10/2023 States that her neck continues to feel "tight" most of the time, and feels tender on L when  she touches it. States R shoulder blade region always feels tight.   Aaron Aas EVAL: Pt with increased pain in L side of neck. States increased pain with lateral rotation, as well as palpation of UT and L sided neck musculature. States "tightness" feeling in neck. She has been able to continue exercise for postural strengthening and cycling.   Hand dominance: Right  PERTINENT HISTORY:   PAIN:  Are you having pain? Yes: NPRS scale: 3/10 Pain location: mid back Pain description: tight, sore Aggravating factors: increased activity, exercise, work duties.  Relieving factors: none  stated   PRECAUTIONS: None  RED FLAGS: None   WEIGHT BEARING RESTRICTIONS: No  FALLS:  Has patient fallen in last 6 months? No   PLOF: Independent  PATIENT GOALS: Decreased pain/muscle tension in neck.   NEXT MD VISIT:   OBJECTIVE:   DIAGNOSTIC FINDINGS:    PATIENT SURVEYS :    COGNITION: Overall cognitive status: Within functional limits for tasks assessed     SENSATION: WFL  POSTURE: Mild asymmetry in t-spine from scoliosis   UPPER EXTREMITY ROM:  Shoulders: WFL Neck: WFL  mild soreness with L and R end range rotation.   UPPER EXTREMITY MMT:  MMT Right eval Left eval  Shoulder flexion 4+ 4+  Shoulder extension    Shoulder abduction 4+ 4+  Shoulder adduction    Shoulder internal rotation    Shoulder external rotation    Middle trapezius    Lower trapezius    Elbow flexion    Elbow extension    Wrist flexion    Wrist extension    Wrist ulnar deviation    Wrist radial deviation    Wrist pronation    Wrist supination    Grip strength (lbs)    (Blank rows = not tested)  SHOULDER SPECIAL TESTS:   JOINT MOBILITY TESTING:  Mild hypomobility for mid/lower c-spine  PALPATION:  Tightness and trigger points in L UT, tightness into scalenes . Minimal tenderness in paraspinals or sub occipitals. L lateral recess tender in mid and upper c-spine.     TODAY'S TREATMENT:                                                                                                                                          DATE:  12/10/2023 Therapeutic Exercise: Supine:   education and review of neck posture with pilates hundred position and modified crunch x 10;  SLR with TA x 10 bil;  Prone:   Quadruped:   SA press with chin tuck 2 x 10;   Standing: Row GTB 2 x 10 cueing for neutral spine posture, as well as options for row motion at gym;   Bil shoulder ER RTB x 15;  Stretches:  cat/cow x 10;  Supine SA reaches x 10;  Neuromuscular Re-education:  Manual Therapy:  TPR R thoracic paraspinals and mid trap Therapeutic Activity: Self Care: Trigger Point Dry Needling  Subsequent Treatment: Instructions provided previously at initial dry needling treatment.  Instructions reviewed, if requested by the patient, prior to subsequent dry needling treatment.   Patient Verbal Consent Given: Yes Education Handout Provided: Previously Provided Muscles Treated: L cervical multifidi,  R low trap/ shelf technique Electrical Stimulation Performed: No Treatment Response/Outcome: palpable increase in muscle length     PATIENT EDUCATION:  Education details: HEP, posture,  Person educated: Patient Education method: Explanation, Demonstration, Tactile cues, Verbal cues, and Handouts Education comprehension: verbalized understanding, returned demonstration, verbal cues required,  tactile cues required, and needs further education   HOME EXERCISE PROGRAM: Seated UT stretch, seated cervical rotation  - review for HEP,  Lateral costal breathing with arm reach towards ceiling, long exhale breathing  ASSESSMENT:  CLINICAL IMPRESSION: Pt to benefit from continued manual for tightness in neck and t-spine. She will also benefit from continued practice and education on neutral spine posture with movement and strengthening. Reviewed need to continue mobility throughout the day, to decrease tightness  sensations.  Reviewed effects of scoliosis likely creating increased muscle tension on R shoulder blade region. Minimal tenderness in L UT, levator or Sub occipitals today. Does have mild tenderness of SCM with palpation.   Eval:  Patient presents with primary complaint of pain and tightness in L cervical region. She has increased muscle tension in Cervical musculature, and increased pain with rotation. She has tenderness to palpate L UT and L lateral paraspinals. She also has scoliosis of t-spine, and will benefit from education on cervical and thoracic mobility and postural strengthening. We discussed improving ergonomics for work set up for neutral head position. Pt with decreased ability for full functional activities, reaching, lifting, carrying, and IADLs. Pt to benefit from skilled PT to improve deficits and pain.    OBJECTIVE IMPAIRMENTS: decreased activity tolerance, decreased ROM, decreased strength, increased muscle spasms, improper body mechanics, and pain.   ACTIVITY LIMITATIONS: carrying, lifting, reach over head, hygiene/grooming, and locomotion level  PARTICIPATION LIMITATIONS: cleaning, laundry, driving, shopping, community activity, occupation, and yard work  PERSONAL FACTORS: Time since onset of injury/illness/exacerbation are also affecting patient's functional outcome.   REHAB POTENTIAL: Good  CLINICAL DECISION MAKING: Stable/uncomplicated  EVALUATION COMPLEXITY: Low  GOALS: Goals reviewed with patient? Yes   SHORT TERM GOALS: Target date: 12/12/2023  Pt to be intendment with initial HEP  Goal status: INITIAL  2. Pt to report decreased muscle tension and pain to 3/10     LONG TERM GOALS: Target date: 01/16/2024   Pt to be independent with final HEP  Goal status: INITIAL  2.  Pt to demo improved ROM to be [facility] and pain free, to improve ability for ADLs.   Goal status: INITIAL  3.  Pt to report improved pain in L side of neck, to 0-2/10 with activity,  exercise and work duties.   Goal status: INITIAL   PLAN: PT FREQUENCY: 1-2x/week  PT DURATION: 8 weeks  PLANNED INTERVENTIONS: Therapeutic exercises, Therapeutic activity, Neuromuscular re-education, Patient/Family education, Self Care, Joint mobilization, Joint manipulation, Stair training, DME instructions, Aquatic Therapy, Dry Needling, Electrical stimulation, Cryotherapy, Moist heat, Taping, Ultrasound, Ionotophoresis 4mg /ml Dexamethasone, Manual therapy,  Vasopneumatic device, Traction, Spinal manipulation, Spinal mobilization,   PLAN FOR NEXT SESSION:  DN thoracic paraspinals - try KT tape post needling  mobilization/stiffness, t-spine mobility, repeated rotation motions, SA press for shoulder blades, L UT tightness, L facet tenderness. Re-check sub occipitals., desk ergonomics, neck/shoulder mobility for work day.    Terrilee Few, PT, DPT 9:29 PM  12/13/23

## 2023-12-17 ENCOUNTER — Encounter: Payer: Self-pay | Admitting: Physical Therapy

## 2023-12-17 ENCOUNTER — Ambulatory Visit (INDEPENDENT_AMBULATORY_CARE_PROVIDER_SITE_OTHER): Admitting: Physical Therapy

## 2023-12-17 DIAGNOSIS — M6281 Muscle weakness (generalized): Secondary | ICD-10-CM

## 2023-12-17 DIAGNOSIS — M542 Cervicalgia: Secondary | ICD-10-CM | POA: Diagnosis not present

## 2023-12-17 NOTE — Therapy (Signed)
 OUTPATIENT PHYSICAL THERAPY UPPER EXTREMITY TREATMENT   Patient Name: Regina [name] MRN: [id number] DOB:1966/03/14, 58 y.o., female Today's Date: 12/10/2023  END OF SESSION:  PT End of Session - 12/17/23 1303     Visit Number 5    Number of Visits 16    Date for PT Re-Evaluation 01/16/24    Authorization Type [facility]/[employer]/save    PT Start Time 1303    PT Stop Time 1341    PT Time Calculation (min) 38 min    Activity Tolerance Patient tolerated treatment well    Behavior During Therapy [facility] for tasks assessed/performed              Past Medical History:  Diagnosis Date   Hormone replacement therapy (HRT)    Hypertension    Migraines    Hormonal   Osteoarthritis of neck    Scoliosis    Past Surgical History:  Procedure Laterality Date   LIPOMA EXCISION Right 1999   shoulder   Patient Active Problem List   Diagnosis Date Noted   Overweight (BMI 25.0-29.9) 07/19/2022   Physical exam 01/03/2022   Stress fracture of left fibula 12/05/2021   Sprain of medial collateral ligament of left knee 08/16/2021   Degenerative tear of lateral meniscus of left knee 08/16/2021   Hormone replacement therapy (HRT) 06/28/2021   Arthritis of first MTP joint 11/17/2020   Hamstring tendinitis at origin 08/18/2020   Hyperlipidemia 09/23/2017   Osteopenia 09/23/2017   ADD (attention deficit disorder) 10/24/2012   HTN (hypertension) 01/28/2012    PCP: Laymon Priest  REFERRING PROVIDER: Glenetta Lane  REFERRING DIAG: Cervicalgia  THERAPY DIAG:  Cervicalgia  Muscle weakness (generalized)  Rationale for Evaluation and Treatment: Rehabilitation  ONSET DATE:   SUBJECTIVE:                                                                                                                                                                                      SUBJECTIVE STATEMENT: 12/10/2023 States that her neck was really bothering her. States that she needs a little elevation in  her bed with her pillow to help with the tension. States when she sleeps wrong it is painful. States she did some weights last night.  Aaron Aas EVAL: Pt with increased pain in L side of neck. States increased pain with lateral rotation, as well as palpation of UT and L sided neck musculature. States "tightness" feeling in neck. She has been able to continue exercise for postural strengthening and cycling.   Hand dominance: Right  PERTINENT HISTORY:   PAIN:  Are you having pain? Yes: NPRS scale: 3/10 Pain location: mid back Pain description: tight, sore  Aggravating factors: increased activity, exercise, work duties.  Relieving factors: none stated   PRECAUTIONS: None  RED FLAGS: None   WEIGHT BEARING RESTRICTIONS: No  FALLS:  Has patient fallen in last 6 months? No   PLOF: Independent  PATIENT GOALS: Decreased pain/muscle tension in neck.   NEXT MD VISIT:   OBJECTIVE:   DIAGNOSTIC FINDINGS:    PATIENT SURVEYS :    COGNITION: Overall cognitive status: Within functional limits for tasks assessed     SENSATION: WFL  POSTURE: Mild asymmetry in t-spine from scoliosis   UPPER EXTREMITY ROM:  Shoulders: WFL Neck: WFL  mild soreness with L and R end range rotation.   UPPER EXTREMITY MMT:  MMT Right eval Left eval  Shoulder flexion 4+ 4+  Shoulder extension    Shoulder abduction 4+ 4+  Shoulder adduction    Shoulder internal rotation    Shoulder external rotation    Middle trapezius    Lower trapezius    Elbow flexion    Elbow extension    Wrist flexion    Wrist extension    Wrist ulnar deviation    Wrist radial deviation    Wrist pronation    Wrist supination    Grip strength (lbs)    (Blank rows = not tested)  SHOULDER SPECIAL TESTS:   JOINT MOBILITY TESTING:  Mild hypomobility for mid/lower c-spine  PALPATION:  Tightness and trigger points in L UT, tightness into scalenes . Minimal tenderness in paraspinals or sub occipitals. L lateral recess  tender in mid and upper c-spine.     TODAY'S TREATMENT:                                                                                                                                         DATE:  12/10/2023 Therapeutic Exercise: Supine:     Prone:  neck holds with reduced thoracic extension 8 minutes with PT assist  Quadruped  Standing:    Stretches:   Neuromuscular Re-education: reduced over active neck flexors in standing and with core exercises - 8 minutes, on sleeping postures and positions to reduce stress on neck - 6 minutes Manual Therapy:  STM to B  cervical paraspinals, anterior scalenes, SCM and suboccipitals, medial glides to right cervical spine grade II/III, PA and UPA to T spine grade II/III Therapeutic Activity: Self Care:     PATIENT EDUCATION:  Education details: HEP, posture,  Person educated: Patient Education method: Explanation, Demonstration, Tactile cues, Verbal cues, and Handouts Education comprehension: verbalized understanding, returned demonstration, verbal cues required, tactile cues required, and needs further education   HOME EXERCISE PROGRAM: Seated UT stretch, seated cervical rotation  - review for HEP,  Lateral costal breathing with arm reach towards ceiling, long exhale breathing  ASSESSMENT:  CLINICAL IMPRESSION: Focused on manual interventions of thoracic and cervical spine which was tolerated well. Educated patient in reduction in overactive cervical spine  with core strengthening exercises and to reduce excessive chin tuck. Added posterior cervical muscle strengthening exercises which was tolerated well. No increase in symptoms noted end of session. Will continue with current POC as tolerated.   Eval:  Patient presents with primary complaint of pain and tightness in L cervical region. She has increased muscle tension in Cervical musculature, and increased pain with rotation. She has tenderness to palpate L UT and L lateral paraspinals. She  also has scoliosis of t-spine, and will benefit from education on cervical and thoracic mobility and postural strengthening. We discussed improving ergonomics for work set up for neutral head position. Pt with decreased ability for full functional activities, reaching, lifting, carrying, and IADLs. Pt to benefit from skilled PT to improve deficits and pain.    OBJECTIVE IMPAIRMENTS: decreased activity tolerance, decreased ROM, decreased strength, increased muscle spasms, improper body mechanics, and pain.   ACTIVITY LIMITATIONS: carrying, lifting, reach over head, hygiene/grooming, and locomotion level  PARTICIPATION LIMITATIONS: cleaning, laundry, driving, shopping, community activity, occupation, and yard work  PERSONAL FACTORS: Time since onset of injury/illness/exacerbation are also affecting patient's functional outcome.   REHAB POTENTIAL: Good  CLINICAL DECISION MAKING: Stable/uncomplicated  EVALUATION COMPLEXITY: Low  GOALS: Goals reviewed with patient? Yes   SHORT TERM GOALS: Target date: 12/12/2023  Pt to be intendment with initial HEP  Goal status: INITIAL  2. Pt to report decreased muscle tension and pain to 3/10     LONG TERM GOALS: Target date: 01/16/2024   Pt to be independent with final HEP  Goal status: INITIAL  2.  Pt to demo improved ROM to be [facility] and pain free, to improve ability for ADLs.   Goal status: INITIAL  3.  Pt to report improved pain in L side of neck, to 0-2/10 with activity, exercise and work duties.   Goal status: INITIAL   PLAN: PT FREQUENCY: 1-2x/week  PT DURATION: 8 weeks  PLANNED INTERVENTIONS: Therapeutic exercises, Therapeutic activity, Neuromuscular re-education, Patient/Family education, Self Care, Joint mobilization, Joint manipulation, Stair training, DME instructions, Aquatic Therapy, Dry Needling, Electrical stimulation, Cryotherapy, Moist heat, Taping, Ultrasound, Ionotophoresis 4mg /ml Dexamethasone, Manual therapy,   Vasopneumatic device, Traction, Spinal manipulation, Spinal mobilization,   PLAN FOR NEXT SESSION:  DN thoracic paraspinals - try KT tape post needling  mobilization/stiffness, t-spine mobility, repeated rotation motions, SA press for shoulder blades, L UT tightness, L facet tenderness. Re-check sub occipitals., desk ergonomics, neck/shoulder mobility for work day.    3:01 PM, 12/17/23 Tabitha Ewings, DPT Physical Therapy with [facility]

## 2023-12-24 ENCOUNTER — Encounter: Payer: Self-pay | Admitting: Physical Therapy

## 2023-12-24 ENCOUNTER — Ambulatory Visit (INDEPENDENT_AMBULATORY_CARE_PROVIDER_SITE_OTHER): Admitting: Physical Therapy

## 2023-12-24 DIAGNOSIS — M6281 Muscle weakness (generalized): Secondary | ICD-10-CM

## 2023-12-24 DIAGNOSIS — M542 Cervicalgia: Secondary | ICD-10-CM | POA: Diagnosis not present

## 2023-12-24 NOTE — Therapy (Signed)
 OUTPATIENT PHYSICAL THERAPY UPPER EXTREMITY TREATMENT   Patient Name: OLIVER NEUWIRTH MRN: [id number] DOB:September 01, 1965, 58 y.o., female Today's Date: 12/24/2023   END OF SESSION:  PT End of Session - 12/24/23 1654     Visit Number 6    Number of Visits 16    Date for PT Re-Evaluation 01/16/24    Authorization Type [facility]/[employer]/save    PT Start Time 1302    PT Stop Time 1344    PT Time Calculation (min) 42 min    Activity Tolerance Patient tolerated treatment well    Behavior During Therapy WFL for tasks assessed/performed               Past Medical History:  Diagnosis Date   Hormone replacement therapy (HRT)    Hypertension    Migraines    Hormonal   Osteoarthritis of neck    Scoliosis    Past Surgical History:  Procedure Laterality Date   LIPOMA EXCISION Right 1999   shoulder   Patient Active Problem List   Diagnosis Date Noted   Overweight (BMI 25.0-29.9) 07/19/2022   Physical exam 01/03/2022   Stress fracture of left fibula 12/05/2021   Sprain of medial collateral ligament of left knee 08/16/2021   Degenerative tear of lateral meniscus of left knee 08/16/2021   Hormone replacement therapy (HRT) 06/28/2021   Arthritis of first MTP joint 11/17/2020   Hamstring tendinitis at origin 08/18/2020   Hyperlipidemia 09/23/2017   Osteopenia 09/23/2017   ADD (attention deficit disorder) 10/24/2012   HTN (hypertension) 01/28/2012    PCP: Laymon Priest  REFERRING PROVIDER: Glenetta Lane  REFERRING DIAG: Cervicalgia  THERAPY DIAG:  Cervicalgia  Muscle weakness (generalized)  Rationale for Evaluation and Treatment: Rehabilitation  ONSET DATE:   SUBJECTIVE:                                                                                                                                                                                      SUBJECTIVE STATEMENT: 12/24/2023 Pt states minimal pain today and last couple days. Did have some pain on L side of neck  with cycling but did not last. Has been trying to keep head elevated small amount for improved comfort with sleeping.   Aaron Aas EVAL: Pt with increased pain in L side of neck. States increased pain with lateral rotation, as well as palpation of UT and L sided neck musculature. States "tightness" feeling in neck. She has been able to continue exercise for postural strengthening and cycling.   Hand dominance: Right  PERTINENT HISTORY:   PAIN:  Are you having pain? Yes: NPRS scale: 3/10 Pain location: mid back Pain description: tight, sore  Aggravating factors: increased activity, exercise, work duties.  Relieving factors: none stated   PRECAUTIONS: None  RED FLAGS: None   WEIGHT BEARING RESTRICTIONS: No  FALLS:  Has patient fallen in last 6 months? No   PLOF: Independent  PATIENT GOALS: Decreased pain/muscle tension in neck.   NEXT MD VISIT:   OBJECTIVE:   DIAGNOSTIC FINDINGS:    PATIENT SURVEYS :    COGNITION: Overall cognitive status: Within functional limits for tasks assessed     SENSATION: WFL  POSTURE: Mild asymmetry in t-spine from scoliosis   UPPER EXTREMITY ROM:  Shoulders: WFL Neck: WFL  mild soreness with L and R end range rotation.   UPPER EXTREMITY MMT:  MMT Right eval Left eval  Shoulder flexion 4+ 4+  Shoulder extension    Shoulder abduction 4+ 4+  Shoulder adduction    Shoulder internal rotation    Shoulder external rotation    Middle trapezius    Lower trapezius    Elbow flexion    Elbow extension    Wrist flexion    Wrist extension    Wrist ulnar deviation    Wrist radial deviation    Wrist pronation    Wrist supination    Grip strength (lbs)    (Blank rows = not tested)  SHOULDER SPECIAL TESTS:   JOINT MOBILITY TESTING:  Mild hypomobility for mid/lower c-spine  PALPATION:  Tightness and trigger points in L UT, tightness into scalenes . Minimal tenderness in paraspinals or sub occipitals. L lateral recess tender in mid  and upper c-spine.     TODAY'S TREATMENT:                                                                                                                                         DATE:   12/24/2023 Therapeutic Exercise: Supine:   TA with cueing for achieving optimal contraction x 10;  SLR with TA 2 x 10 bil; review of core activation for pilates movements.  Prone:   Quadruped: thread the needle/foam roller 2 x 5 bil;  Standing:  Rows Blue TB x20 , review of bent over row options x 10 bil; - education on body position with all;  Stretches:   Neuromuscular Re-education:  Manual Therapy:   STM to B  cervical paraspinals, anterior scalenes, L UT, levator,  PA s to C-spine and T spine grade II/III;  Manual cervical distraction;  Therapeutic Activity: Self Care:    12/10/2023 Therapeutic Exercise: Supine:     Prone:  neck holds with reduced thoracic extension 8 minutes with PT assist  Quadruped  Standing:    Stretches:   Neuromuscular Re-education: reduced over active neck flexors in standing and with core exercises - 8 minutes, on sleeping postures and positions to reduce stress on neck - 6 minutes Manual Therapy:  STM to B  cervical paraspinals, anterior scalenes, SCM and suboccipitals, medial glides to  right cervical spine grade II/III, PA and UPA to T spine grade II/III Therapeutic Activity: Self Care:     PATIENT EDUCATION:  Education details: HEP, posture,  Person educated: Patient Education method: Explanation, Demonstration, Tactile cues, Verbal cues, and Handouts Education comprehension: verbalized understanding, returned demonstration, verbal cues required, tactile cues required, and needs further education   HOME EXERCISE PROGRAM: Seated UT stretch, seated cervical rotation  - review for HEP,  Lateral costal breathing with arm reach towards ceiling, long exhale breathing  ASSESSMENT:  CLINICAL IMPRESSION: Pt with decreased soreness with palpation of cervical  musculature today from previous visits. Reviewed importance of frequent mobility throughout day to manage tightness. Reviewed body and spine posture with all ther ex today. Pt with improvement in postural awareness with ther ex today, but will benefit from continued education and work on this Will benefit from continued care.   Eval:  Patient presents with primary complaint of pain and tightness in L cervical region. She has increased muscle tension in Cervical musculature, and increased pain with rotation. She has tenderness to palpate L UT and L lateral paraspinals. She also has scoliosis of t-spine, and will benefit from education on cervical and thoracic mobility and postural strengthening. We discussed improving ergonomics for work set up for neutral head position. Pt with decreased ability for full functional activities, reaching, lifting, carrying, and IADLs. Pt to benefit from skilled PT to improve deficits and pain.    OBJECTIVE IMPAIRMENTS: decreased activity tolerance, decreased ROM, decreased strength, increased muscle spasms, improper body mechanics, and pain.   ACTIVITY LIMITATIONS: carrying, lifting, reach over head, hygiene/grooming, and locomotion level  PARTICIPATION LIMITATIONS: cleaning, laundry, driving, shopping, community activity, occupation, and yard work  PERSONAL FACTORS: Time since onset of injury/illness/exacerbation are also affecting patient's functional outcome.   REHAB POTENTIAL: Good  CLINICAL DECISION MAKING: Stable/uncomplicated  EVALUATION COMPLEXITY: Low  GOALS: Goals reviewed with patient? Yes   SHORT TERM GOALS: Target date: 12/12/2023  Pt to be intendment with initial HEP  Goal status: INITIAL  2. Pt to report decreased muscle tension and pain to 3/10     LONG TERM GOALS: Target date: 01/16/2024   Pt to be independent with final HEP  Goal status: INITIAL  2.  Pt to demo improved ROM to be [facility] and pain free, to improve ability for ADLs.    Goal status: INITIAL  3.  Pt to report improved pain in L side of neck, to 0-2/10 with activity, exercise and work duties.   Goal status: INITIAL   PLAN: PT FREQUENCY: 1-2x/week  PT DURATION: 8 weeks  PLANNED INTERVENTIONS: Therapeutic exercises, Therapeutic activity, Neuromuscular re-education, Patient/Family education, Self Care, Joint mobilization, Joint manipulation, Stair training, DME instructions, Aquatic Therapy, Dry Needling, Electrical stimulation, Cryotherapy, Moist heat, Taping, Ultrasound, Ionotophoresis 4mg /ml Dexamethasone, Manual therapy,  Vasopneumatic device, Traction, Spinal manipulation, Spinal mobilization,   PLAN FOR NEXT SESSION:  DN thoracic paraspinals - try KT tape post needling  mobilization/stiffness, t-spine mobility, repeated rotation motions, SA press for shoulder blades, L UT tightness, L facet tenderness. Re-check sub occipitals., desk ergonomics, neck/shoulder mobility for work day.   Terrilee Few, PT, DPT 5:02 PM  12/24/23

## 2023-12-31 ENCOUNTER — Encounter: Payer: Self-pay | Admitting: Physical Therapy

## 2023-12-31 ENCOUNTER — Ambulatory Visit (INDEPENDENT_AMBULATORY_CARE_PROVIDER_SITE_OTHER): Admitting: Physical Therapy

## 2023-12-31 DIAGNOSIS — M542 Cervicalgia: Secondary | ICD-10-CM | POA: Diagnosis not present

## 2023-12-31 DIAGNOSIS — M6281 Muscle weakness (generalized): Secondary | ICD-10-CM

## 2023-12-31 NOTE — Therapy (Signed)
 OUTPATIENT PHYSICAL THERAPY UPPER EXTREMITY TREATMENT   Patient Name: Regina [name] MRN: [id number] DOB:01/01/1966, 58 y.o., female Today's Date: 12/31/2023   END OF SESSION:  PT End of Session - 12/31/23 1255     Visit Number 7    Number of Visits 16    Date for PT Re-Evaluation 01/16/24    Authorization Type [facility]/aetna/save    PT Start Time 1302    PT Stop Time 1343    PT Time Calculation (min) 41 min    Activity Tolerance Patient tolerated treatment well    Behavior During Therapy [facility] for tasks assessed/performed            Past Medical History:  Diagnosis Date   Hormone replacement therapy (HRT)    Hypertension    Migraines    Hormonal   Osteoarthritis of neck    Scoliosis    Past Surgical History:  Procedure Laterality Date   LIPOMA EXCISION Right 1999   shoulder   Patient Active Problem List   Diagnosis Date Noted   Overweight (BMI 25.0-29.9) 07/19/2022   Physical exam 01/03/2022   Stress fracture of left fibula 12/05/2021   Sprain of medial collateral ligament of left knee 08/16/2021   Degenerative tear of lateral meniscus of left knee 08/16/2021   Hormone replacement therapy (HRT) 06/28/2021   Arthritis of first MTP joint 11/17/2020   Hamstring tendinitis at origin 08/18/2020   Hyperlipidemia 09/23/2017   Osteopenia 09/23/2017   ADD (attention deficit disorder) 10/24/2012   HTN (hypertension) 01/28/2012    PCP: Laymon Priest  REFERRING PROVIDER: Glenetta Lane  REFERRING DIAG: Cervicalgia  THERAPY DIAG:  Cervicalgia  Muscle weakness (generalized)  Rationale for Evaluation and Treatment: Rehabilitation  ONSET DATE:   SUBJECTIVE:                                                                                                                                                                                      SUBJECTIVE STATEMENT: 12/31/2023 Pt states she does the band exercises in the office more. States she did the gym and also did  the lat pull downs and triceps. States she has been trying to do more planks and side planks. Neck is still the same place on the UT with movement. Reports it feels 30-40% better. The UT is a newer thing and now that pain is more bothersome in regards to its consistency.   Aaron Aas EVAL: Pt with increased pain in L side of neck. States increased pain with lateral rotation, as well as palpation of UT and L sided neck musculature. States tightness feeling in neck. She has been able to continue exercise for postural strengthening  and cycling.   Hand dominance: Right  PERTINENT HISTORY:   PAIN:  Are you having pain? Yes: NPRS scale: 5/10 Pain location: mid back Pain description: tight, sore Aggravating factors: increased activity, exercise, work duties.  Relieving factors: none stated   PRECAUTIONS: None  RED FLAGS: None   WEIGHT BEARING RESTRICTIONS: No  FALLS:  Has patient fallen in last 6 months? No   PLOF: Independent  PATIENT GOALS: Decreased pain/muscle tension in neck.   NEXT MD VISIT:   OBJECTIVE:   DIAGNOSTIC FINDINGS:    PATIENT SURVEYS :    COGNITION: Overall cognitive status: Within functional limits for tasks assessed     SENSATION: WFL  POSTURE: Mild asymmetry in t-spine from scoliosis   UPPER EXTREMITY ROM:  Shoulders: WFL Neck: WFL  mild soreness with L and R end range rotation.   UPPER EXTREMITY MMT:  MMT Right eval Left eval  Shoulder flexion 4+ 4+  Shoulder extension    Shoulder abduction 4+ 4+  Shoulder adduction    Shoulder internal rotation    Shoulder external rotation    Middle trapezius    Lower trapezius    Elbow flexion    Elbow extension    Wrist flexion    Wrist extension    Wrist ulnar deviation    Wrist radial deviation    Wrist pronation    Wrist supination    Grip strength (lbs)    (Blank rows = not tested)  SHOULDER SPECIAL TESTS:   JOINT MOBILITY TESTING:  Mild hypomobility for mid/lower  c-spine  PALPATION:  Tightness and trigger points in L UT, tightness into scalenes . Minimal tenderness in paraspinals or sub occipitals. L lateral recess tender in mid and upper c-spine.     TODAY'S TREATMENT:                                                                                                                                         DATE:   12/31/2023 Therapeutic Exercise: Supine:   PROM cervical spine, Cervical ROT with chin tuck supine --> prone  review of aggravating activities, educated in self use of traction machine  Prone:   Scapular retraction/depression with head lift in neutral 6 minutes total Standing:  ;  Stretches:   Neuromuscular Re-education:  Manual Therapy:   STM to B  cervical paraspinals, anterior scalenes, L UT, levator,  PA s to C-spine and T spine grade II/III;  Manual cervical distraction Therapeutic Activity: Self Care:      PATIENT EDUCATION:  Education details: HEP, posture, safe use of traction machine Person educated: Patient Education method: Explanation, Demonstration, Tactile cues, Verbal cues, and Handouts Education comprehension: verbalized understanding, returned demonstration, verbal cues required, tactile cues required, and needs further education   HOME EXERCISE PROGRAM: Seated UT stretch, seated cervical rotation  - review for HEP,  Lateral costal breathing with arm reach towards ceiling, long  exhale breathing  ASSESSMENT:  CLINICAL IMPRESSION: Continued soreness in neck. Focused on manual which was tolerated well. Slight improvement in thoracic mobility after mobilizations but still very stiff. Added prone neck ROM and cued patient to reduce lower thoracic extension (patient compensates here to lift head). Added new exercise to HEP. Continued soreness end of session.   Eval:  Patient presents with primary complaint of pain and tightness in L cervical region. She has increased muscle tension in Cervical musculature, and  increased pain with rotation. She has tenderness to palpate L UT and L lateral paraspinals. She also has scoliosis of t-spine, and will benefit from education on cervical and thoracic mobility and postural strengthening. We discussed improving ergonomics for work set up for neutral head position. Pt with decreased ability for full functional activities, reaching, lifting, carrying, and IADLs. Pt to benefit from skilled PT to improve deficits and pain.    OBJECTIVE IMPAIRMENTS: decreased activity tolerance, decreased ROM, decreased strength, increased muscle spasms, improper body mechanics, and pain.   ACTIVITY LIMITATIONS: carrying, lifting, reach over head, hygiene/grooming, and locomotion level  PARTICIPATION LIMITATIONS: cleaning, laundry, driving, shopping, community activity, occupation, and yard work  PERSONAL FACTORS: Time since onset of injury/illness/exacerbation are also affecting patient's functional outcome.   REHAB POTENTIAL: Good  CLINICAL DECISION MAKING: Stable/uncomplicated  EVALUATION COMPLEXITY: Low  GOALS: Goals reviewed with patient? Yes   SHORT TERM GOALS: Target date: 12/12/2023  Pt to be intendment with initial HEP  Goal status: INITIAL  2. Pt to report decreased muscle tension and pain to 3/10     LONG TERM GOALS: Target date: 01/16/2024   Pt to be independent with final HEP  Goal status: INITIAL  2.  Pt to demo improved ROM to be [facility] and pain free, to improve ability for ADLs.   Goal status: INITIAL  3.  Pt to report improved pain in L side of neck, to 0-2/10 with activity, exercise and work duties.   Goal status: INITIAL   PLAN: PT FREQUENCY: 1-2x/week  PT DURATION: 8 weeks  PLANNED INTERVENTIONS: Therapeutic exercises, Therapeutic activity, Neuromuscular re-education, Patient/Family education, Self Care, Joint mobilization, Joint manipulation, Stair training, DME instructions, Aquatic Therapy, Dry Needling, Electrical stimulation,  Cryotherapy, Moist heat, Taping, Ultrasound, Ionotophoresis 4mg /ml Dexamethasone, Manual therapy,  Vasopneumatic device, Traction, Spinal manipulation, Spinal mobilization,   PLAN FOR NEXT SESSION:  DN thoracic paraspinals - try KT tape post needling  mobilization/stiffness, t-spine mobility, repeated rotation motions, SA press for shoulder blades, L UT tightness, L facet tenderness. Re-check sub occipitals., desk ergonomics, neck/shoulder mobility for work day.   1:49 PM, 12/31/23 Tabitha Ewings, DPT Physical Therapy with [facility]

## 2024-01-01 ENCOUNTER — Other Ambulatory Visit: Payer: Self-pay | Admitting: Family Medicine

## 2024-01-02 ENCOUNTER — Other Ambulatory Visit: Payer: Self-pay

## 2024-01-02 ENCOUNTER — Other Ambulatory Visit (HOSPITAL_COMMUNITY): Payer: Self-pay

## 2024-01-02 MED ORDER — OLMESARTAN MEDOXOMIL 20 MG PO TABS
30.0000 mg | ORAL_TABLET | Freq: Every day | ORAL | 1 refills | Status: DC
  Filled 2024-01-02: qty 135, 90d supply, fill #0
  Filled 2024-04-25 – 2024-04-30 (×2): qty 135, 90d supply, fill #1

## 2024-01-03 ENCOUNTER — Other Ambulatory Visit: Payer: Self-pay

## 2024-01-06 ENCOUNTER — Other Ambulatory Visit (HOSPITAL_COMMUNITY): Payer: Self-pay

## 2024-01-06 ENCOUNTER — Other Ambulatory Visit: Payer: Self-pay

## 2024-01-07 ENCOUNTER — Other Ambulatory Visit (HOSPITAL_COMMUNITY): Payer: Self-pay

## 2024-01-07 ENCOUNTER — Other Ambulatory Visit (HOSPITAL_BASED_OUTPATIENT_CLINIC_OR_DEPARTMENT_OTHER): Payer: Self-pay

## 2024-01-07 ENCOUNTER — Other Ambulatory Visit: Payer: Self-pay

## 2024-01-08 ENCOUNTER — Other Ambulatory Visit: Payer: Self-pay

## 2024-01-14 ENCOUNTER — Encounter: Payer: Self-pay | Admitting: Family Medicine

## 2024-01-14 ENCOUNTER — Other Ambulatory Visit (HOSPITAL_BASED_OUTPATIENT_CLINIC_OR_DEPARTMENT_OTHER): Payer: Self-pay

## 2024-01-14 MED ORDER — AMPHETAMINE-DEXTROAMPHET ER 20 MG PO CP24
20.0000 mg | ORAL_CAPSULE | Freq: Every day | ORAL | 0 refills | Status: DC
  Filled 2024-01-14: qty 90, 90d supply, fill #0

## 2024-01-14 NOTE — Telephone Encounter (Signed)
 Patient is requesting a slight decrease in Adderall  dose, please advise  Requested Prescriptions   Pending Prescriptions Disp Refills   amphetamine -dextroamphetamine  (ADDERALL  XR) 20 MG 24 hr capsule 90 capsule 0    Sig: Take 1 capsule (20 mg total) by mouth daily.     Date of patient request: 01/14/2024 Last office visit: 08/08/2023 Upcoming visit: 02/06/2024 Pattricia of last refill: 09/30/2023 Last refill amount: 90

## 2024-01-29 ENCOUNTER — Other Ambulatory Visit (HOSPITAL_BASED_OUTPATIENT_CLINIC_OR_DEPARTMENT_OTHER): Payer: Self-pay

## 2024-01-29 ENCOUNTER — Telehealth: Payer: Self-pay

## 2024-01-29 ENCOUNTER — Other Ambulatory Visit: Payer: Self-pay

## 2024-01-29 DIAGNOSIS — F988 Other specified behavioral and emotional disorders with onset usually occurring in childhood and adolescence: Secondary | ICD-10-CM

## 2024-01-29 MED ORDER — AMPHETAMINE-DEXTROAMPHET ER 15 MG PO CP24
15.0000 mg | ORAL_CAPSULE | ORAL | 0 refills | Status: DC
  Filled 2024-01-29: qty 90, 90d supply, fill #0

## 2024-01-29 NOTE — Telephone Encounter (Signed)
 Patients original request to decrease dose is in MyChart Messages from 01/14/2024

## 2024-01-29 NOTE — Telephone Encounter (Signed)
 Copied from CRM [id number]. Topic: Clinical - Medication Question >> Jan 29, 2024  8:33 AM Chasity T wrote: Reason for CRM: Patient is calling in regarding prescription amphetamine -dextroamphetamine  (ADDERALL  XR) 20 MG 24 hr capsule. She has requested for it to go down to 15 mg but its still showing 20 mg. Please contact her to discuss the dosage.

## 2024-01-29 NOTE — Telephone Encounter (Signed)
 Chart reviewed with PCP out of office.  I do see the request for decreased dose to 15 mg on message from 01/14/2024.  Controlled substance database reviewed, last filled med in April.  Will send in the 15 mg dose of Adderall .  Message sent to patient.

## 2024-02-06 ENCOUNTER — Ambulatory Visit: Payer: 59 | Admitting: Family Medicine

## 2024-02-10 ENCOUNTER — Other Ambulatory Visit (HOSPITAL_BASED_OUTPATIENT_CLINIC_OR_DEPARTMENT_OTHER): Payer: Self-pay

## 2024-02-10 ENCOUNTER — Other Ambulatory Visit: Payer: Self-pay

## 2024-03-03 ENCOUNTER — Other Ambulatory Visit (HOSPITAL_BASED_OUTPATIENT_CLINIC_OR_DEPARTMENT_OTHER): Payer: Self-pay

## 2024-03-03 MED ORDER — XIIDRA 5 % OP SOLN
1.0000 [drp] | Freq: Two times a day (BID) | OPHTHALMIC | 4 refills | Status: AC
  Filled 2024-03-03: qty 180, 90d supply, fill #0

## 2024-03-09 ENCOUNTER — Other Ambulatory Visit (HOSPITAL_BASED_OUTPATIENT_CLINIC_OR_DEPARTMENT_OTHER): Payer: Self-pay

## 2024-03-10 DIAGNOSIS — D225 Melanocytic nevi of trunk: Secondary | ICD-10-CM | POA: Insufficient documentation

## 2024-03-10 DIAGNOSIS — D2370 Other benign neoplasm of skin of unspecified lower limb, including hip: Secondary | ICD-10-CM | POA: Insufficient documentation

## 2024-03-10 DIAGNOSIS — L649 Androgenic alopecia, unspecified: Secondary | ICD-10-CM | POA: Insufficient documentation

## 2024-03-10 DIAGNOSIS — L609 Nail disorder, unspecified: Secondary | ICD-10-CM | POA: Insufficient documentation

## 2024-03-10 DIAGNOSIS — D1801 Hemangioma of skin and subcutaneous tissue: Secondary | ICD-10-CM | POA: Insufficient documentation

## 2024-03-11 ENCOUNTER — Other Ambulatory Visit (HOSPITAL_BASED_OUTPATIENT_CLINIC_OR_DEPARTMENT_OTHER): Payer: Self-pay

## 2024-03-11 ENCOUNTER — Ambulatory Visit: Admitting: Family Medicine

## 2024-03-11 ENCOUNTER — Encounter: Payer: Self-pay | Admitting: Family Medicine

## 2024-03-11 ENCOUNTER — Other Ambulatory Visit: Payer: Self-pay

## 2024-03-11 VITALS — BP 128/72 | HR 68 | Temp 98.7°F | Ht 65.0 in | Wt 153.1 lb

## 2024-03-11 DIAGNOSIS — F988 Other specified behavioral and emotional disorders with onset usually occurring in childhood and adolescence: Secondary | ICD-10-CM

## 2024-03-11 DIAGNOSIS — E785 Hyperlipidemia, unspecified: Secondary | ICD-10-CM | POA: Diagnosis not present

## 2024-03-11 DIAGNOSIS — I10 Essential (primary) hypertension: Secondary | ICD-10-CM | POA: Diagnosis not present

## 2024-03-11 DIAGNOSIS — R7303 Prediabetes: Secondary | ICD-10-CM | POA: Diagnosis not present

## 2024-03-11 DIAGNOSIS — E663 Overweight: Secondary | ICD-10-CM | POA: Diagnosis not present

## 2024-03-11 DIAGNOSIS — D649 Anemia, unspecified: Secondary | ICD-10-CM | POA: Diagnosis not present

## 2024-03-11 DIAGNOSIS — Z780 Asymptomatic menopausal state: Secondary | ICD-10-CM

## 2024-03-11 LAB — VITAMIN D 25 HYDROXY (VIT D DEFICIENCY, FRACTURES): VITD: 50.46 ng/mL (ref 30.00–100.00)

## 2024-03-11 LAB — HEMOGLOBIN A1C: Hgb A1c MFr Bld: 5.8 % (ref 4.6–6.5)

## 2024-03-11 LAB — FERRITIN: Ferritin: 5.6 ng/mL — ABNORMAL LOW (ref 10.0–291.0)

## 2024-03-11 LAB — HIGH SENSITIVITY CRP: CRP, High Sensitivity: 1.17 mg/L (ref 0.000–5.000)

## 2024-03-11 MED ORDER — ESTRADIOL 1 MG PO TABS
1.5000 mg | ORAL_TABLET | Freq: Every day | ORAL | 1 refills | Status: AC
  Filled 2024-03-11 – 2024-04-09 (×2): qty 135, 90d supply, fill #0
  Filled 2024-07-23: qty 135, 90d supply, fill #1

## 2024-03-11 MED ORDER — AMPHETAMINE-DEXTROAMPHET ER 15 MG PO CP24
15.0000 mg | ORAL_CAPSULE | ORAL | 0 refills | Status: AC
  Filled 2024-03-11 – 2024-06-10 (×3): qty 90, 90d supply, fill #0

## 2024-03-11 NOTE — Assessment & Plan Note (Signed)
 Pt's last A1C 5.8%  She is interested in her A1C and insulin  levels today.  Will follow.

## 2024-03-11 NOTE — Assessment & Plan Note (Signed)
 Pt is down 6 lbs since last visit. BMI 25.48.  She reports she is very active in the summer but tends to gain weight in the winter.  Encouraged healthy diet and regular exercise.  Will follow.

## 2024-03-11 NOTE — Patient Instructions (Signed)
 Schedule your complete physical in 6 months We'll notify you of your lab results and make any changes if needed Keep up the good work on healthy diet and regular exercise- you look great!!! Call with any questions or concerns Stay Safe!  Stay Healthy! [place]!!!

## 2024-03-11 NOTE — Progress Notes (Signed)
   Subjective:    Patient ID: Regina [name], female    DOB: 13-Sep-1965, 58 y.o.   MRN: [id number]  HPI HTN- chronic problem, on Olmesartan  30mg  daily, hydrochlorothiazide  6.25mg  daily w/ good control.  Denies CP, SOB, HA's, visual changes, edema.  Overweight- pt is down 6 lbs since last visit.  BMI now 25.48  Very active during the summer.  Insulin  resistance- previous A1C 5.8.  pt is down 6 lbs since.   Review of Systems For ROS see HPI     Objective:   Physical Exam Vitals reviewed.  Constitutional:      General: She is not in acute distress.    Appearance: Normal appearance. She is well-developed. She is not ill-appearing.  HENT:     Head: Normocephalic and atraumatic.  Eyes:     Conjunctiva/sclera: Conjunctivae normal.     Pupils: Pupils are equal, round, and reactive to light.  Neck:     Thyroid : No thyromegaly.  Cardiovascular:     Rate and Rhythm: Normal rate and regular rhythm.     Pulses: Normal pulses.     Heart sounds: Normal heart sounds. No murmur heard. Pulmonary:     Effort: Pulmonary effort is normal. No respiratory distress.     Breath sounds: Normal breath sounds.  Abdominal:     General: There is no distension.     Palpations: Abdomen is soft.     Tenderness: There is no abdominal tenderness.  Musculoskeletal:     Cervical back: Normal range of motion and neck supple.     Right lower leg: No edema.     Left lower leg: No edema.  Lymphadenopathy:     Cervical: No cervical adenopathy.  Skin:    General: Skin is warm and dry.  Neurological:     General: No focal deficit present.     Mental Status: She is alert and oriented to person, place, and time.  Psychiatric:        Mood and Affect: Mood normal.        Behavior: Behavior normal.        Thought Content: Thought content normal.           Assessment & Plan:

## 2024-03-11 NOTE — Assessment & Plan Note (Signed)
 Chronic problem.  Well controlled on Olmesartan  and hydrochlorothiazide .  Currently asymptomatic.  Check labs due to ARB and diuretic use but no anticipated med changes.

## 2024-03-12 ENCOUNTER — Ambulatory Visit: Payer: Self-pay | Admitting: Family Medicine

## 2024-03-12 ENCOUNTER — Other Ambulatory Visit (HOSPITAL_BASED_OUTPATIENT_CLINIC_OR_DEPARTMENT_OTHER): Payer: Self-pay

## 2024-03-12 LAB — INSULIN, RANDOM: Insulin: 6.8 u[IU]/mL

## 2024-03-12 NOTE — Progress Notes (Signed)
 LVM to call office about lab results

## 2024-03-17 ENCOUNTER — Other Ambulatory Visit (HOSPITAL_BASED_OUTPATIENT_CLINIC_OR_DEPARTMENT_OTHER): Payer: Self-pay

## 2024-03-17 ENCOUNTER — Other Ambulatory Visit: Payer: Self-pay | Admitting: Student in an Organized Health Care Education/Training Program

## 2024-03-18 ENCOUNTER — Other Ambulatory Visit: Payer: Self-pay

## 2024-03-18 ENCOUNTER — Other Ambulatory Visit (HOSPITAL_BASED_OUTPATIENT_CLINIC_OR_DEPARTMENT_OTHER): Payer: Self-pay

## 2024-03-18 ENCOUNTER — Telehealth: Payer: Self-pay

## 2024-03-18 DIAGNOSIS — Z79890 Hormone replacement therapy (postmenopausal): Secondary | ICD-10-CM

## 2024-03-18 MED ORDER — PROGESTERONE MICRONIZED 100 MG PO CAPS
300.0000 mg | ORAL_CAPSULE | Freq: Every day | ORAL | 0 refills | Status: DC
  Filled 2024-03-18 – 2024-04-09 (×2): qty 270, 90d supply, fill #0

## 2024-03-18 MED ORDER — AMPHETAMINE-DEXTROAMPHETAMINE 10 MG PO TABS
10.0000 mg | ORAL_TABLET | Freq: Every day | ORAL | 0 refills | Status: AC
  Filled 2024-03-18 – 2024-03-19 (×2): qty 90, 90d supply, fill #0

## 2024-03-18 NOTE — Telephone Encounter (Signed)
 Patient is requesting a 90 day supply on progesterone .

## 2024-03-19 ENCOUNTER — Other Ambulatory Visit (HOSPITAL_BASED_OUTPATIENT_CLINIC_OR_DEPARTMENT_OTHER): Payer: Self-pay

## 2024-03-20 LAB — FATTY ACID PANEL, ESSENTIAL (C12-C22), SERUM
Arachidic, C20:0: 34.69 umol/L (ref 16.8–38.5)
Arachidonic, C20:4w6: 912.03 umol/L (ref 351.4–1057.6)
DHA, C22:6w3: 232.6 umol/L — ABNORMAL HIGH (ref 53.3–216.3)
DPA, C22:5w3: 41.18 umol/L (ref 19.9–77.6)
DPA, C22:5w6: 27.55 umol/L (ref 9.2–32.1)
DTA, C22:4w6: 26.95 umol/L (ref 9.1–44.6)
Docosenoic, C22:1: 10.6 umol/L (ref 5.73–11.92)
EPA, C20:5w3: 44.44 umol/L (ref 12.4–123)
Hexadecenoic, C16:1w9: 59.73 umol/L (ref 19.82–59.93)
LAURIC,C12:0: 25.05 umol/L (ref 4.3–36)
Linoleic, C18:2w6: 5222.89 umol/L (ref 2653.4–6130.3)
Mead, C20:3w9: 16.85 umol/L (ref 10.3–41.3)
Myristic, C14:0: 171.97 umol/L (ref 39.4–258.2)
Nervonic, C24:1w9: 116.12 umol/L (ref 56.9–132.7)
Oleic, C18:1w9: 3192.17 umol/L (ref 872.4–4182.1)
Palmitic, C16:0: 3970.93 umol/L (ref 1671.1–4602.6)
Palmitoleic, C16:1w7: 196.66 umol/L (ref 68.5–570.2)
Stearic, C18:0: 1030.35 umol/L (ref 590.2–1377.2)
Total Fatty Acids: 16.2 umol/L (ref 7.93–18.34)
Total Monounsaturated: 3.79 umol/L (ref 1.45–4.95)
Total Polyunsaturated: 6.95 umol/L (ref 3.57–8.11)
Total Saturated: 5.46 umol/L (ref 2.5–6.4)
Total w3: 0.41 umol/L (ref 0.1–0.5)
Total w6: 6.52 umol/L (ref 3.3–7.1)
Triene Tetraene Ratio: 0.02 (ref 0.02–0.05)
Vaccenic, C18:1w7: 182.82 umol/L (ref 84.8–260.8)
a Linolenic, C18:3w3: 88.15 umol/L (ref 26.1–150.1)
g Linolenic, C18:3w6: 50.61 umol/L (ref 14.5–144.9)
h g-Linolenic, C20:3w6: 278.09 umol/L (ref 70–319.9)

## 2024-04-09 ENCOUNTER — Other Ambulatory Visit: Payer: Self-pay | Admitting: Family Medicine

## 2024-04-09 ENCOUNTER — Other Ambulatory Visit: Payer: Self-pay

## 2024-04-09 ENCOUNTER — Other Ambulatory Visit (HOSPITAL_COMMUNITY): Payer: Self-pay

## 2024-04-09 MED ORDER — HYDROCHLOROTHIAZIDE 12.5 MG PO TABS
6.2500 mg | ORAL_TABLET | Freq: Every day | ORAL | 1 refills | Status: AC
  Filled 2024-04-09: qty 45, 90d supply, fill #0
  Filled 2024-07-10: qty 45, 90d supply, fill #1

## 2024-04-10 ENCOUNTER — Other Ambulatory Visit: Payer: Self-pay

## 2024-04-13 ENCOUNTER — Other Ambulatory Visit: Payer: Self-pay

## 2024-04-14 ENCOUNTER — Other Ambulatory Visit (HOSPITAL_COMMUNITY): Payer: Self-pay

## 2024-04-25 ENCOUNTER — Encounter: Payer: Self-pay | Admitting: Family Medicine

## 2024-04-25 DIAGNOSIS — F988 Other specified behavioral and emotional disorders with onset usually occurring in childhood and adolescence: Secondary | ICD-10-CM

## 2024-04-30 ENCOUNTER — Other Ambulatory Visit (HOSPITAL_COMMUNITY): Payer: Self-pay

## 2024-05-01 ENCOUNTER — Other Ambulatory Visit (HOSPITAL_COMMUNITY): Payer: Self-pay

## 2024-05-06 NOTE — Progress Notes (Unsigned)
 Regina [name] [facility] [address] [place] [address] Phone: [phone] Subjective:   [name], am serving as a scribe for Dr. Arthea [name].  I'm seeing this patient by the request  of:  Mahlon Comer BRAVO, MD  CC: neck pain   YEP:Dlagzrupcz  Regina [name] is a 58 y.o. female coming in with complaint of neck pain. Patient states L side neck pain that can radiate into trap. Last xrays 9 years ago. Hurts the most in the morning.       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  Tobacco Use   Smoking status: Former   Smokeless tobacco: Never   Tobacco comments:    quit 30 yrs ago   Substance and Sexual Activity   Alcohol use: Yes    Alcohol/week: 2.0 standard drinks of alcohol    Types: 2 Glasses of wine per week    Comment: Rarely   Drug use: No   Sexual activity: Yes    Partners: Male    Birth control/protection: Post-menopausal  Other Topics Concern   Not on file  Social History Narrative   Not on file   Social Drivers of Health   Financial Resource Strain: Not on file  Food Insecurity: Not on file  Transportation Needs: Not on file  Physical Activity: Not on file  Stress: Not on file  Social Connections: Not on file   Allergies  Allergen Reactions   Lisinopril Nausea And Vomiting   Family History  Problem Relation Age of Onset   Hypertension Mother    Diabetes Mother    Emotional abuse Mother    Alcohol abuse Mother    Colon cancer Neg Hx    Colon  polyps Neg Hx    Rectal cancer Neg Hx    Stomach cancer Neg Hx    Breast cancer Neg Hx     Current Outpatient Medications (Endocrine & Metabolic):    estradiol  (ESTRACE ) 1 MG tablet, Take 1.5 tablets (1.5 mg total) by mouth daily.   progesterone  (PROMETRIUM ) 100 MG capsule, Take 3 capsules (300 mg total) by mouth daily.   Current Outpatient Medications (Cardiovascular):    hydrochlorothiazide  (HYDRODIURIL ) 12.5 MG tablet, Take 0.5 tablets (6.25 mg total) by mouth daily.   olmesartan  (BENICAR ) 20 MG tablet, Take 1.5 tablets (30 mg total) by mouth daily.         Current Outpatient Medications (Other):    amphetamine -dextroamphetamine  (ADDERALL  XR) 15 MG 24 hr capsule, Take 1 capsule by mouth every morning.   amphetamine -dextroamphetamine  (ADDERALL ) 10 MG tablet, Take 1 tablet by mouth daily in the afternoon for additional focus as needed   Biotin 89999 MCG TABS, Take 1 tablet by mouth daily.   Lifitegrast  (XIIDRA ) 5 % SOLN, Place 1 drop into both eyes 2 (two) times daily.   Magnesium 500 MG CAPS, Take 1 capsule by mouth daily.   Minoxidil  (ROGAINE  MENS EXTRA  STRENGTH) 5 % FOAM, 0.5 Applications.   TURMERIC PO, Take 1,500 mg by mouth daily.   VITAMIN D , ERGOCALCIFEROL , PO, Take 2,000 mg by mouth daily.   Current Facility-Administered Medications (Other):    0.9 %  sodium chloride  infusion   Reviewed prior external information including notes and imaging from  primary care provider As well as notes that were available from care everywhere and other healthcare systems.  Past medical history, social, surgical and family history all reviewed in electronic medical record.  No pertanent information unless stated regarding to the chief complaint.   Review of Systems:  No headache, visual changes, nausea, vomiting, diarrhea, constipation, dizziness, abdominal pain, skin rash, fevers, chills, night sweats, weight loss, swollen lymph nodes, body aches, joint swelling, chest pain,  shortness of breath, mood changes. POSITIVE muscle aches  Objective  Last menstrual period 04/29/2010.   General: No apparent distress alert and oriented x3 mood and affect normal, dressed appropriately.  HEENT: Pupils equal, extraocular movements intact  Respiratory: Patient's speak in full sentences and does not appear short of breath  Cardiovascular: No lower extremity edema, non tender, no erythema  Neck exam shows some limitation noted, with some tenderness to palpation mostly on the anterior aspect of the neck.  Patient has some difficulty with flexion of the neck and more with sidebending on the left side.  Fullness noted of the thyroid    Osteopathic findings C2 flexed rotated and side bent left C4 flexed rotated and side bent left C6 flexed rotated and side bent left T3 extended rotated and side bent left inhaled third rib T5 extended rotated and side bent left     Impression and Recommendations:    Degenerative disc disease, cervical Degenerative disc disease noted, discussed icing regimen and home exercises, discussed posture and ergonomics, discussed working environment, gabapentin 100 mg given for nighttime.  Increase activity slowly.  Tried osteopathic manipulation and hopeful that will make some improvement.  Follow-up again in 6 to 8 weeks otherwise.    Decision today to treat with OMT was based on Physical Exam  After verbal consent patient was treated with HVLA, ME, FPR techniques in cervical, thoracic, rib, areas, all areas are chronic   Patient tolerated the procedure well with improvement in symptoms  Patient given exercises, stretches and lifestyle modifications  See medications in patient instructions if given  Patient will follow up in 4-8 weeks  The above documentation has been reviewed and is accurate and complete Ferdinand Revoir M Leanza Shepperson, DO

## 2024-05-07 ENCOUNTER — Ambulatory Visit: Admitting: Family Medicine

## 2024-05-07 ENCOUNTER — Ambulatory Visit

## 2024-05-07 ENCOUNTER — Encounter: Payer: Self-pay | Admitting: Family Medicine

## 2024-05-07 ENCOUNTER — Other Ambulatory Visit (HOSPITAL_BASED_OUTPATIENT_CLINIC_OR_DEPARTMENT_OTHER): Payer: Self-pay

## 2024-05-07 VITALS — BP 128/90 | HR 51 | Ht 65.0 in | Wt 155.0 lb

## 2024-05-07 DIAGNOSIS — M9902 Segmental and somatic dysfunction of thoracic region: Secondary | ICD-10-CM

## 2024-05-07 DIAGNOSIS — M5030 Other cervical disc degeneration, unspecified cervical region: Secondary | ICD-10-CM

## 2024-05-07 DIAGNOSIS — M4802 Spinal stenosis, cervical region: Secondary | ICD-10-CM | POA: Diagnosis not present

## 2024-05-07 DIAGNOSIS — M47812 Spondylosis without myelopathy or radiculopathy, cervical region: Secondary | ICD-10-CM | POA: Diagnosis not present

## 2024-05-07 DIAGNOSIS — M5032 Other cervical disc degeneration, mid-cervical region, unspecified level: Secondary | ICD-10-CM | POA: Diagnosis not present

## 2024-05-07 DIAGNOSIS — M542 Cervicalgia: Secondary | ICD-10-CM

## 2024-05-07 DIAGNOSIS — M9901 Segmental and somatic dysfunction of cervical region: Secondary | ICD-10-CM

## 2024-05-07 DIAGNOSIS — M9908 Segmental and somatic dysfunction of rib cage: Secondary | ICD-10-CM | POA: Diagnosis not present

## 2024-05-07 MED ORDER — GABAPENTIN 100 MG PO CAPS
100.0000 mg | ORAL_CAPSULE | Freq: Every day | ORAL | 0 refills | Status: AC
  Filled 2024-05-07: qty 35, 35d supply, fill #0
  Filled 2024-05-07: qty 55, 55d supply, fill #0

## 2024-05-07 NOTE — Patient Instructions (Addendum)
 Gabapentin 100mg  Xrays today Do prescribed exercises at least 3x a week See you again in 2-3 months

## 2024-05-07 NOTE — Assessment & Plan Note (Signed)
 Degenerative disc disease noted, discussed icing regimen and home exercises, discussed posture and ergonomics, discussed working environment, gabapentin 100 mg given for nighttime.  Increase activity slowly.  Tried osteopathic manipulation and hopeful that will make some improvement.  Follow-up again in 6 to 8 weeks otherwise.

## 2024-05-11 ENCOUNTER — Ambulatory Visit: Payer: Self-pay | Admitting: Family Medicine

## 2024-05-11 ENCOUNTER — Ambulatory Visit: Admitting: Family Medicine

## 2024-05-11 ENCOUNTER — Encounter: Payer: Self-pay | Admitting: Family Medicine

## 2024-05-11 ENCOUNTER — Other Ambulatory Visit (HOSPITAL_BASED_OUTPATIENT_CLINIC_OR_DEPARTMENT_OTHER): Payer: Self-pay

## 2024-05-11 VITALS — BP 110/70 | HR 75 | Temp 97.8°F | Ht 65.0 in | Wt 155.2 lb

## 2024-05-11 DIAGNOSIS — R790 Abnormal level of blood mineral: Secondary | ICD-10-CM | POA: Insufficient documentation

## 2024-05-11 DIAGNOSIS — E041 Nontoxic single thyroid nodule: Secondary | ICD-10-CM | POA: Diagnosis not present

## 2024-05-11 DIAGNOSIS — E559 Vitamin D deficiency, unspecified: Secondary | ICD-10-CM | POA: Insufficient documentation

## 2024-05-11 LAB — VITAMIN D 25 HYDROXY (VIT D DEFICIENCY, FRACTURES): VITD: 51.53 ng/mL (ref 30.00–100.00)

## 2024-05-11 MED ORDER — OLMESARTAN MEDOXOMIL 20 MG PO TABS
40.0000 mg | ORAL_TABLET | Freq: Every day | ORAL | 1 refills | Status: DC
  Filled 2024-05-11 – 2024-07-10 (×2): qty 180, 90d supply, fill #0

## 2024-05-11 NOTE — Assessment & Plan Note (Signed)
 She has increased her oral intake and would like labs rechecked.  Will follow.

## 2024-05-11 NOTE — Patient Instructions (Addendum)
 Follow up as needed or as scheduled We'll notify you of your lab results and make any changes if needed Continue the iron supplement daily Add a stool softener as needed for constipation now that you're taking the iron Keep up the good work- you look great! Call with any questions or concerns Stay Safe!  Stay Healthy! Hang in there!!!

## 2024-05-11 NOTE — Progress Notes (Signed)
   Subjective:    Patient ID: Regina [name], female    DOB: 1965-09-12, 58 y.o.   MRN: [id number]  HPI Thyroid  nodule- pt had [facility]. Had small thyroid  nodule w/ some calcium visible.  Vit D deficiency- last value was 50. Has increased oral intake.  Would like labs rechecked today.  Low ferritin- last value 5.6. now on iron supplement daily.  Pt reports this explains some of her hairloss and fatigue.   Review of Systems For ROS see HPI     Objective:   Physical Exam Vitals reviewed.  Constitutional:      General: She is not in acute distress.    Appearance: Normal appearance. She is not ill-appearing.  HENT:     Head: Normocephalic and atraumatic.  Eyes:     Extraocular Movements: Extraocular movements intact.     Conjunctiva/sclera: Conjunctivae normal.  Cardiovascular:     Rate and Rhythm: Normal rate.  Pulmonary:     Effort: Pulmonary effort is normal. No respiratory distress.  Skin:    General: Skin is warm and dry.  Neurological:     General: No focal deficit present.     Mental Status: She is alert and oriented to person, place, and time.  Psychiatric:        Mood and Affect: Mood normal.        Behavior: Behavior normal.        Thought Content: Thought content normal.           Assessment & Plan:

## 2024-05-11 NOTE — Assessment & Plan Note (Signed)
 New.  Had in-office US  at [facility] that showed small nodule and calcifications.  This has pt concerned.  Will get formal US  w/ radiology read.  Check labs.

## 2024-05-11 NOTE — Assessment & Plan Note (Signed)
 New at last visit.  Pt states this explains fatigue, hair loss.  She is now on iron supplement daily.  Will repeat labs to assess for improvement.

## 2024-05-12 ENCOUNTER — Ambulatory Visit: Payer: Self-pay | Admitting: Family Medicine

## 2024-05-12 LAB — PTH, INTACT AND CALCIUM
Calcium: 9.8 mg/dL (ref 8.6–10.4)
PTH: 28 pg/mL (ref 16–77)

## 2024-05-12 LAB — IRON,TIBC AND FERRITIN PANEL
%SAT: 16 % (ref 16–45)
Ferritin: 30 ng/mL (ref 16–232)
Iron: 68 ug/dL (ref 45–160)
TIBC: 420 ug/dL (ref 250–450)

## 2024-05-12 NOTE — Progress Notes (Signed)
 Called patient and left vm to return call. Please relay message if patient calls back

## 2024-05-19 ENCOUNTER — Ambulatory Visit (HOSPITAL_BASED_OUTPATIENT_CLINIC_OR_DEPARTMENT_OTHER)
Admission: RE | Admit: 2024-05-19 | Discharge: 2024-05-19 | Disposition: A | Discharge status: Home / Self Care | Source: Ambulatory Visit | Attending: Family Medicine | Admitting: Family Medicine

## 2024-05-19 DIAGNOSIS — E042 Nontoxic multinodular goiter: Secondary | ICD-10-CM | POA: Diagnosis not present

## 2024-05-19 DIAGNOSIS — E041 Nontoxic single thyroid nodule: Secondary | ICD-10-CM | POA: Insufficient documentation

## 2024-05-21 ENCOUNTER — Encounter: Payer: Self-pay | Admitting: Family Medicine

## 2024-05-24 ENCOUNTER — Other Ambulatory Visit (HOSPITAL_BASED_OUTPATIENT_CLINIC_OR_DEPARTMENT_OTHER): Payer: Self-pay

## 2024-05-24 ENCOUNTER — Other Ambulatory Visit: Payer: Self-pay | Admitting: Family Medicine

## 2024-05-24 MED ORDER — TRETINOIN 0.05 % EX CREA
TOPICAL_CREAM | Freq: Every day | CUTANEOUS | 0 refills | Status: AC
  Filled 2024-05-24: qty 45, 60d supply, fill #0

## 2024-05-24 NOTE — Progress Notes (Signed)
 Derm appt delayed.  Req retin a

## 2024-05-25 ENCOUNTER — Other Ambulatory Visit (HOSPITAL_BASED_OUTPATIENT_CLINIC_OR_DEPARTMENT_OTHER): Payer: Self-pay

## 2024-06-02 NOTE — Progress Notes (Signed)
 Called patient and left vm to return. Letter will be sent out. 3rd attempt

## 2024-06-10 ENCOUNTER — Other Ambulatory Visit (HOSPITAL_BASED_OUTPATIENT_CLINIC_OR_DEPARTMENT_OTHER): Payer: Self-pay

## 2024-06-10 ENCOUNTER — Other Ambulatory Visit: Payer: Self-pay

## 2024-06-15 NOTE — Telephone Encounter (Signed)
 Patient sent in a mychart message below with her Blood sugar readings on 05/21/2024 when you were out of office. Patient is wanting to start Metformin ER 500 ( 1/2 pill BID) if you think this is reasonable?   Would you like patient to come in for an appointment to discuss this?

## 2024-06-16 NOTE — Telephone Encounter (Signed)
 Pt would like to proceed with Immediate release medication

## 2024-06-17 ENCOUNTER — Other Ambulatory Visit (HOSPITAL_BASED_OUTPATIENT_CLINIC_OR_DEPARTMENT_OTHER): Payer: Self-pay

## 2024-06-17 ENCOUNTER — Other Ambulatory Visit: Payer: Self-pay

## 2024-06-17 MED ORDER — METFORMIN HCL 500 MG PO TABS
500.0000 mg | ORAL_TABLET | Freq: Every day | ORAL | 3 refills | Status: AC
  Filled 2024-06-17: qty 90, 90d supply, fill #0

## 2024-06-17 NOTE — Addendum Note (Signed)
 Addended by: Areesha Dehaven E on: 06/17/2024 01:24 PM   Modules accepted: Orders

## 2024-06-19 DIAGNOSIS — F4320 Adjustment disorder, unspecified: Secondary | ICD-10-CM | POA: Diagnosis not present

## 2024-06-23 DIAGNOSIS — M542 Cervicalgia: Secondary | ICD-10-CM | POA: Diagnosis not present

## 2024-06-30 ENCOUNTER — Ambulatory Visit (HOSPITAL_BASED_OUTPATIENT_CLINIC_OR_DEPARTMENT_OTHER)
Admission: RE | Admit: 2024-06-30 | Discharge: 2024-06-30 | Disposition: A | Source: Ambulatory Visit | Attending: Family Medicine | Admitting: Family Medicine

## 2024-06-30 DIAGNOSIS — Z780 Asymptomatic menopausal state: Secondary | ICD-10-CM | POA: Diagnosis not present

## 2024-06-30 DIAGNOSIS — M542 Cervicalgia: Secondary | ICD-10-CM | POA: Diagnosis not present

## 2024-07-01 NOTE — Progress Notes (Signed)
 Called patient and lvm to return call. If patient calls back, please relay lab results.

## 2024-07-03 ENCOUNTER — Other Ambulatory Visit (HOSPITAL_BASED_OUTPATIENT_CLINIC_OR_DEPARTMENT_OTHER): Payer: Self-pay

## 2024-07-03 MED ORDER — TRETINOIN 0.1 % EX CREA
TOPICAL_CREAM | CUTANEOUS | 3 refills | Status: AC
  Filled 2024-07-03: qty 20, 30d supply, fill #0
  Filled 2024-07-23 – 2024-08-12 (×4): qty 45, 30d supply, fill #0

## 2024-07-05 ENCOUNTER — Encounter: Payer: Self-pay | Admitting: Family Medicine

## 2024-07-06 NOTE — Telephone Encounter (Signed)
 Patient called about this msg. Has not been seen or x-rayed for the L knee since 2023 with Dr. Chick. Advised her it may be tough to get an MRI approved without this clinical documentation.  Not sure if we would work in with any provider, have xray ordered at [facility]?

## 2024-07-06 NOTE — Telephone Encounter (Signed)
 Pt called again, scheduled with Leonce 07/07/2024.

## 2024-07-07 ENCOUNTER — Ambulatory Visit (INDEPENDENT_AMBULATORY_CARE_PROVIDER_SITE_OTHER): Admitting: Sports Medicine

## 2024-07-07 ENCOUNTER — Ambulatory Visit

## 2024-07-07 ENCOUNTER — Other Ambulatory Visit (HOSPITAL_BASED_OUTPATIENT_CLINIC_OR_DEPARTMENT_OTHER): Payer: Self-pay

## 2024-07-07 VITALS — HR 76 | Ht 65.0 in | Wt 155.0 lb

## 2024-07-07 DIAGNOSIS — M2352 Chronic instability of knee, left knee: Secondary | ICD-10-CM

## 2024-07-07 DIAGNOSIS — M25562 Pain in left knee: Secondary | ICD-10-CM

## 2024-07-07 DIAGNOSIS — M23301 Other meniscus derangements, unspecified lateral meniscus, left knee: Secondary | ICD-10-CM

## 2024-07-07 DIAGNOSIS — M542 Cervicalgia: Secondary | ICD-10-CM

## 2024-07-07 DIAGNOSIS — G8929 Other chronic pain: Secondary | ICD-10-CM | POA: Diagnosis not present

## 2024-07-07 MED ORDER — CYCLOBENZAPRINE HCL 5 MG PO TABS
5.0000 mg | ORAL_TABLET | Freq: Every day | ORAL | 0 refills | Status: AC
  Filled 2024-07-07: qty 60, 60d supply, fill #0

## 2024-07-07 NOTE — Progress Notes (Signed)
 "               [name] [address] [place] [address] Phone: [phone]   Assessment and Plan:     1. Chronic pain of left knee (Primary) 2. Degenerative tear of lateral meniscus of left knee 3. Recurrent left knee instability -Chronic with exacerbation, subsequent visit - Recurrence of left knee pain, instability worsening since new popping sensation 1 week ago.  Most consistent with lateral meniscal pathology such as tear - X-ray obtained today.  My interpretation: No acute fracture or dislocation - Recommend further evaluation with left knee MRI due to left knee instability, recurrent pain that is not resolved despite >6 weeks of conservative therapy, pain with day-to-day activities, pain >6/10 - Use Tylenol 500 to 1000 mg tablets 2-3 times a day for day-to-day pain relief   4. Neck pain -Chronic with exacerbation, subsequent visit - Continued neck pain most consistent with cervical DDD - As patient's pain is waking patient up at night, we will trial Flexeril  5 to 10 mg nightly to be used as needed for muscle spasms, decreasing pain to improve sleep    Pertinent previous records reviewed include none   Follow Up: 1 week after MRI to review results with either myself or Dr. [name].  Could consider CSI, NSAID course, orthopedic surgery referral based on results   Subjective:   I, Regina [name], am serving as a neurosurgeon for Doctor [name]  Chief Complaint: left knee pain   HPI:   07/07/2024 Patient is a 58 year old female with left knee pain. Patient states pain started 2 nights ago. She felt a pop. Pain radiates down and up the leg to the glute. 3 years ago she had a injury like this. Decreased ROM.  She would like to discuss an MRI.   She would like an MRI of her neck as well. Decreased ROM.    Relevant Historical Information: Hypertension, prediabetes  Additional pertinent review of systems negative.  Current  Medications[1]   Objective:     Vitals:   07/07/24 1123  Pulse: 76  SpO2: 98%  Weight: 155 lb (70.3 kg)  Height: 5' 5 (1.651 m)      Body mass index is 25.79 kg/m.    Physical Exam:    General:  awake, alert oriented, no acute distress nontoxic Skin: no suspicious lesions or rashes Neuro:sensation intact and strength 5/5 with no deficits, no atrophy, normal muscle tone Psych: No signs of anxiety, depression or other mood disorder  Left knee: Mild swelling No deformity Neg fluid wave, joint milking ROM Flex 110, Ext 0 TTP lateral femoral condyle NTTP over the quad tendon, medial fem condyle, , patella, patella tendon, tibial tuberostiy, fibular head, posterior fossa, pes anserine bursa, gerdy's tubercle, medial jt line, lateral jt line Neg anterior and posterior drawer Neg lachman Negative varus stress Negative valgus stress Negative McMurray for palpable pop, however reproduce lateral knee pain with valgus stress Negative Thessaly  Gait normal    Electronically signed by:  [name] 11:43 AM 07/07/2024     [1]  Current Outpatient Medications:    cyclobenzaprine  (FLEXERIL ) 5 MG tablet, Take 1 tablet (5 mg total) by mouth at bedtime., Disp: 60 tablet, Rfl: 0   amphetamine -dextroamphetamine  (ADDERALL  XR) 15 MG 24 hr capsule, Take 1 capsule by mouth every morning., Disp: 90 capsule, Rfl: 0   amphetamine -dextroamphetamine  (ADDERALL ) 10 MG tablet, Take 1  tablet by mouth daily in the afternoon for additional focus as needed, Disp: 90 tablet, Rfl: 0   Biotin 10000 MCG TABS, Take 1 tablet by mouth daily., Disp: , Rfl:    estradiol  (ESTRACE ) 1 MG tablet, Take 1.5 tablets (1.5 mg total) by mouth daily., Disp: 135 tablet, Rfl: 1   gabapentin  (NEURONTIN ) 100 MG capsule, Take 1 capsule (100 mg total) by mouth at bedtime., Disp: 90 capsule, Rfl: 0   hydrochlorothiazide  (HYDRODIURIL ) 12.5 MG tablet, Take 0.5 tablets (6.25 mg total) by mouth  daily., Disp: 45 tablet, Rfl: 1   Lifitegrast  (XIIDRA ) 5 % SOLN, Place 1 drop into both eyes 2 (two) times daily., Disp: 180 each, Rfl: 4   Magnesium 500 MG CAPS, Take 1 capsule by mouth daily., Disp: , Rfl:    metFORMIN  (GLUCOPHAGE ) 500 MG tablet, Take 1 tablet (500 mg total) by mouth daily with breakfast., Disp: 90 tablet, Rfl: 3   Minoxidil  (ROGAINE  MENS EXTRA STRENGTH) 5 % FOAM, 0.5 Applications., Disp: , Rfl:    olmesartan  (BENICAR ) 20 MG tablet, Take 2 tablets (40 mg total) by mouth daily., Disp: 180 tablet, Rfl: 1   progesterone  (PROMETRIUM ) 100 MG capsule, Take 3 capsules (300 mg total) by mouth daily., Disp: 270 capsule, Rfl: 0   tretinoin  (RETIN-A ) 0.05 % cream, Apply topically at bedtime., Disp: 45 g, Rfl: 0   tretinoin  (RETIN-A ) 0.1 % cream, Apply 1 application a pearl-sized amount to face daily in the evening, Disp: 45 g, Rfl: 3   TURMERIC PO, Take 1,500 mg by mouth daily., Disp: , Rfl:    VITAMIN D , ERGOCALCIFEROL , PO, Take 2,000 mg by mouth daily. , Disp: , Rfl:   Current Facility-Administered Medications:    0.9 %  sodium chloride  infusion, 500 mL, Intravenous, Continuous, Avram Lupita BRAVO, MD  "

## 2024-07-07 NOTE — Patient Instructions (Signed)
 Left knee MRI   Tylenol [phone] mg 2-3 times a day for pain relief   Flexeril  5-10 mg nightly as needed   Follow up 1 week after MRI to discuss results

## 2024-07-10 ENCOUNTER — Other Ambulatory Visit: Payer: Self-pay

## 2024-07-10 ENCOUNTER — Other Ambulatory Visit: Payer: Self-pay | Admitting: Family Medicine

## 2024-07-10 ENCOUNTER — Other Ambulatory Visit (HOSPITAL_BASED_OUTPATIENT_CLINIC_OR_DEPARTMENT_OTHER): Payer: Self-pay

## 2024-07-10 DIAGNOSIS — Z79890 Hormone replacement therapy (postmenopausal): Secondary | ICD-10-CM

## 2024-07-10 MED ORDER — OLMESARTAN MEDOXOMIL 20 MG PO TABS: 40.0000 mg | ORAL_TABLET | Freq: Every day | ORAL | 1 refills | Status: AC

## 2024-07-10 MED ORDER — PROGESTERONE MICRONIZED 100 MG PO CAPS
300.0000 mg | ORAL_CAPSULE | Freq: Every day | ORAL | 0 refills | Status: AC
  Filled 2024-07-10: qty 100, 34d supply, fill #0
  Filled 2024-07-10: qty 170, 56d supply, fill #0
  Filled 2024-07-10: qty 10, 4d supply, fill #0
  Filled 2024-07-10: qty 260, 86d supply, fill #0
  Filled 2024-07-13: qty 270, 90d supply, fill #0

## 2024-07-12 ENCOUNTER — Ambulatory Visit

## 2024-07-12 DIAGNOSIS — M2352 Chronic instability of knee, left knee: Secondary | ICD-10-CM

## 2024-07-12 DIAGNOSIS — G8929 Other chronic pain: Secondary | ICD-10-CM | POA: Diagnosis not present

## 2024-07-12 DIAGNOSIS — M25562 Pain in left knee: Secondary | ICD-10-CM | POA: Diagnosis not present

## 2024-07-12 DIAGNOSIS — M23301 Other meniscus derangements, unspecified lateral meniscus, left knee: Secondary | ICD-10-CM

## 2024-07-12 DIAGNOSIS — M542 Cervicalgia: Secondary | ICD-10-CM

## 2024-07-13 ENCOUNTER — Other Ambulatory Visit (HOSPITAL_BASED_OUTPATIENT_CLINIC_OR_DEPARTMENT_OTHER): Payer: Self-pay

## 2024-07-13 NOTE — Progress Notes (Unsigned)
 " [name] [address] [place] [address] Phone: [phone] Subjective:   Regina [name], am serving as a scribe for Dr. Arthea [name].  I'm seeing this patient by the request  of:  Regina Comer BRAVO, MD  CC: Neck pain follow-up, knee pain follow-up  YEP:Regina [name]  Regina [name] is a 58 y.o. female coming in with complaint of back and neck pain. OMT on 05/07/2024. Patient states saw Dr. Leonce for knee and has MRI in. Here for neck f/u.  Medications patient has been prescribed: gabapentin   Taking:      Independently visualized the MRI does show some potential abnormality noted of the proximal ACL but not a full tear.  Patient does have some patellofemoral and lateral arthritic changes but no acute findings.  Some degenerative tearing of the lateral meniscus with no significant displacement.   Reviewed prior external information including notes and imaging from previsou exam, outside providers and external EMR if available.   As well as notes that were available from care everywhere and other healthcare systems.  Past medical history, social, surgical and family history all reviewed in electronic medical record.  No pertanent information unless stated regarding to the chief complaint.   Past Medical History:  Diagnosis Date   Hormone replacement therapy (HRT)    Hypertension    Migraines    Hormonal   Osteoarthritis of neck    Scoliosis     Allergies[1]   Review of Systems:  No headache, visual changes, nausea, vomiting, diarrhea, constipation, dizziness, abdominal pain, skin rash, fevers, chills, night sweats, weight loss, swollen lymph nodes, body aches, joint swelling, chest pain, shortness of breath, mood changes. POSITIVE muscle aches  Objective  Blood pressure 132/86, pulse 86, height 5' 5 (1.651 m), last menstrual period 04/29/2010, SpO2 97%.   General: No apparent distress alert and oriented x3 mood and affect  normal, dressed appropriately.  HEENT: Pupils equal, extraocular movements intact  Respiratory: Patient's speak in full sentences and does not appear short of breath  Cardiovascular: No lower extremity edema, non tender, no erythema  Gait MSK:  Neck does have tightness noted in the left side of the neck.  Tender to palpation diffusely.  Tightness noted in the parascapular area left greater than right  Osteopathic findings  C2 flexed rotated and side bent left C6 flexed rotated and side bent left T3 extended rotated and side bent left inhaled rib T6 extended rotated and side bent left inhaled rib L2 flexed rotated and side bent right Sacrum right on right       Assessment and Plan:  Degenerative tear of lateral meniscus of left knee This with underlying degenerative changes of the knee and may need injections  Degenerative disc disease, cervical Chronic problem, discussed the potential for advanced imaging which patient wants to hold at this time.  Attempted osteopathic manipulation.  Discussed medications and given Zanaflex 2 mg to take at night to see how patient would respond and see if it would help some of the muscles.  Has been responding well to the meloxicam .  Discussed icing regimen otherwise.  Follow-up again in 6 to    Nonallopathic problems  Decision today to treat with OMT was based on Physical Exam  After verbal consent patient was treated with HVLA, ME, FPR techniques in cervical, rib, thoracic areas  Patient tolerated the procedure well with improvement in symptoms  Patient given exercises, stretches and lifestyle modifications  See medications in patient  instructions if given  Patient will follow up in 4-8 weeks     The above documentation has been reviewed and is accurate and complete Regina Byron M Shafiq Larch, DO         Note: This dictation was prepared with Dragon dictation along with smaller phrase technology. Any transcriptional errors that result from  this process are unintentional.            [1]  Allergies Allergen Reactions   Lisinopril Nausea And Vomiting   "

## 2024-07-14 ENCOUNTER — Ambulatory Visit (INDEPENDENT_AMBULATORY_CARE_PROVIDER_SITE_OTHER): Admitting: Family Medicine

## 2024-07-14 ENCOUNTER — Ambulatory Visit: Payer: Self-pay | Admitting: Family Medicine

## 2024-07-14 ENCOUNTER — Encounter: Payer: Self-pay | Admitting: Family Medicine

## 2024-07-14 ENCOUNTER — Other Ambulatory Visit (HOSPITAL_BASED_OUTPATIENT_CLINIC_OR_DEPARTMENT_OTHER): Payer: Self-pay

## 2024-07-14 VITALS — BP 132/86 | HR 86 | Ht 65.0 in

## 2024-07-14 DIAGNOSIS — M76892 Other specified enthesopathies of left lower limb, excluding foot: Secondary | ICD-10-CM | POA: Insufficient documentation

## 2024-07-14 DIAGNOSIS — M5030 Other cervical disc degeneration, unspecified cervical region: Secondary | ICD-10-CM | POA: Diagnosis not present

## 2024-07-14 DIAGNOSIS — M23301 Other meniscus derangements, unspecified lateral meniscus, left knee: Secondary | ICD-10-CM

## 2024-07-14 DIAGNOSIS — M9901 Segmental and somatic dysfunction of cervical region: Secondary | ICD-10-CM | POA: Diagnosis not present

## 2024-07-14 DIAGNOSIS — M9902 Segmental and somatic dysfunction of thoracic region: Secondary | ICD-10-CM

## 2024-07-14 DIAGNOSIS — M9908 Segmental and somatic dysfunction of rib cage: Secondary | ICD-10-CM

## 2024-07-14 DIAGNOSIS — M542 Cervicalgia: Secondary | ICD-10-CM | POA: Diagnosis not present

## 2024-07-14 MED ORDER — TIZANIDINE HCL 2 MG PO TABS
2.0000 mg | ORAL_TABLET | Freq: Every day | ORAL | 0 refills | Status: AC
  Filled 2024-07-14: qty 30, 30d supply, fill #0

## 2024-07-14 NOTE — Assessment & Plan Note (Signed)
 Chronic problem, discussed the potential for advanced imaging which patient wants to hold at this time.  Attempted osteopathic manipulation.  Discussed medications and given Zanaflex 2 mg to take at night to see how patient would respond and see if it would help some of the muscles.  Has been responding well to the meloxicam .  Discussed icing regimen otherwise.  Follow-up again in 6 to

## 2024-07-14 NOTE — Assessment & Plan Note (Signed)
 Hamstring exercises with a very mild heel lift added.

## 2024-07-14 NOTE — Assessment & Plan Note (Signed)
 This with underlying degenerative changes of the knee and may need injections

## 2024-07-14 NOTE — Patient Instructions (Addendum)
 Zanaflex 2mg  at night [employer] ex Will contact you about MRI If neck worsens consider MRI See me in 2 months

## 2024-07-23 ENCOUNTER — Other Ambulatory Visit (HOSPITAL_COMMUNITY): Payer: Self-pay

## 2024-07-24 ENCOUNTER — Other Ambulatory Visit (HOSPITAL_COMMUNITY): Payer: Self-pay

## 2024-07-24 ENCOUNTER — Other Ambulatory Visit: Payer: Self-pay

## 2024-07-27 ENCOUNTER — Other Ambulatory Visit: Payer: Self-pay

## 2024-07-29 ENCOUNTER — Encounter: Payer: Self-pay | Admitting: Pharmacist

## 2024-07-29 ENCOUNTER — Other Ambulatory Visit: Payer: Self-pay

## 2024-07-29 ENCOUNTER — Other Ambulatory Visit (HOSPITAL_COMMUNITY): Payer: Self-pay

## 2024-08-04 ENCOUNTER — Other Ambulatory Visit (HOSPITAL_COMMUNITY): Payer: Self-pay

## 2024-08-12 ENCOUNTER — Other Ambulatory Visit (HOSPITAL_BASED_OUTPATIENT_CLINIC_OR_DEPARTMENT_OTHER): Payer: Self-pay

## 2024-08-12 ENCOUNTER — Other Ambulatory Visit (HOSPITAL_COMMUNITY): Payer: Self-pay

## 2024-08-12 MED ORDER — TRETINOIN 0.1 % EX CREA
1.0000 | TOPICAL_CREAM | Freq: Every evening | CUTANEOUS | 1 refills | Status: AC
  Filled 2024-08-12: qty 45, 30d supply, fill #0

## 2024-08-13 ENCOUNTER — Encounter: Payer: Self-pay | Admitting: Family Medicine

## 2024-08-13 DIAGNOSIS — D649 Anemia, unspecified: Secondary | ICD-10-CM

## 2024-08-13 NOTE — Telephone Encounter (Signed)
 Okay to order ferratin for future?

## 2024-08-14 ENCOUNTER — Other Ambulatory Visit

## 2024-08-14 ENCOUNTER — Ambulatory Visit: Payer: Self-pay | Admitting: Family Medicine

## 2024-08-14 ENCOUNTER — Other Ambulatory Visit (INDEPENDENT_AMBULATORY_CARE_PROVIDER_SITE_OTHER)

## 2024-08-14 DIAGNOSIS — D649 Anemia, unspecified: Secondary | ICD-10-CM | POA: Diagnosis not present

## 2024-08-14 LAB — IBC + FERRITIN
Ferritin: 20 ng/mL (ref 10.0–291.0)
Iron: 147 ug/dL — ABNORMAL HIGH (ref 42–145)
Saturation Ratios: 36.8 % (ref 20.0–50.0)
TIBC: 399 ug/dL (ref 250.0–450.0)
Transferrin: 285 mg/dL (ref 212.0–360.0)

## 2024-09-16 ENCOUNTER — Encounter: Admitting: Family Medicine
